# Patient Record
Sex: Male | Born: 1955 | Race: White | Hispanic: No | Marital: Single | Smoking: Never smoker
Health system: Southern US, Community
[De-identification: ages and names within clinical notes are randomized; demographics above are authoritative.]

## PROBLEM LIST (undated history)

## (undated) DIAGNOSIS — F17200 Nicotine dependence, unspecified, uncomplicated: Secondary | ICD-10-CM

## (undated) DIAGNOSIS — R221 Localized swelling, mass and lump, neck: Secondary | ICD-10-CM

## (undated) DIAGNOSIS — Z720 Tobacco use: Secondary | ICD-10-CM

## (undated) DIAGNOSIS — I10 Essential (primary) hypertension: Secondary | ICD-10-CM

## (undated) DIAGNOSIS — I219 Acute myocardial infarction, unspecified: Secondary | ICD-10-CM

## (undated) DIAGNOSIS — E785 Hyperlipidemia, unspecified: Secondary | ICD-10-CM

## (undated) DIAGNOSIS — I214 Non-ST elevation (NSTEMI) myocardial infarction: Secondary | ICD-10-CM

## (undated) HISTORY — DX: Non-ST elevation (NSTEMI) myocardial infarction: I21.4

## (undated) HISTORY — DX: Nicotine dependence, unspecified, uncomplicated: F17.200

---

## 2010-09-29 ENCOUNTER — Emergency Department (HOSPITAL_COMMUNITY)
Admission: EM | Admit: 2010-09-29 | Discharge: 2010-09-29 | Payer: Self-pay | Source: Home / Self Care | Admitting: Emergency Medicine

## 2010-10-01 ENCOUNTER — Emergency Department (HOSPITAL_COMMUNITY)
Admission: EM | Admit: 2010-10-01 | Discharge: 2010-10-01 | Payer: Self-pay | Source: Home / Self Care | Admitting: Emergency Medicine

## 2011-08-18 ENCOUNTER — Emergency Department (HOSPITAL_COMMUNITY): Payer: Self-pay

## 2011-08-18 ENCOUNTER — Emergency Department (HOSPITAL_COMMUNITY)
Admission: EM | Admit: 2011-08-18 | Discharge: 2011-08-18 | Disposition: A | Discharge status: Home / Self Care | Payer: Self-pay | Attending: Emergency Medicine | Admitting: Emergency Medicine

## 2011-08-18 DIAGNOSIS — K859 Acute pancreatitis without necrosis or infection, unspecified: Secondary | ICD-10-CM | POA: Insufficient documentation

## 2011-08-18 DIAGNOSIS — R112 Nausea with vomiting, unspecified: Secondary | ICD-10-CM | POA: Insufficient documentation

## 2011-08-18 LAB — COMPREHENSIVE METABOLIC PANEL
ALT: 63 U/L — ABNORMAL HIGH (ref 0–53)
Albumin: 3.5 g/dL (ref 3.5–5.2)
Alkaline Phosphatase: 158 U/L — ABNORMAL HIGH (ref 39–117)
Potassium: 4.1 mEq/L (ref 3.5–5.1)
Sodium: 135 mEq/L (ref 135–145)
Total Protein: 6.8 g/dL (ref 6.0–8.3)

## 2011-08-18 LAB — URINE MICROSCOPIC-ADD ON

## 2011-08-18 LAB — URINALYSIS, ROUTINE W REFLEX MICROSCOPIC
Hgb urine dipstick: NEGATIVE
Nitrite: POSITIVE — AB
Specific Gravity, Urine: 1.026 (ref 1.005–1.030)
pH: 6.5 (ref 5.0–8.0)

## 2011-08-18 LAB — CBC
MCHC: 35.3 g/dL (ref 30.0–36.0)
Platelets: 183 10*3/uL (ref 150–400)
RDW: 13.4 % (ref 11.5–15.5)

## 2011-08-18 LAB — DIFFERENTIAL
Basophils Absolute: 0 10*3/uL (ref 0.0–0.1)
Basophils Relative: 0 % (ref 0–1)
Eosinophils Absolute: 0 10*3/uL (ref 0.0–0.7)
Eosinophils Relative: 0 % (ref 0–5)
Monocytes Absolute: 0.7 10*3/uL (ref 0.1–1.0)
Neutro Abs: 7.9 10*3/uL — ABNORMAL HIGH (ref 1.7–7.7)

## 2016-07-31 ENCOUNTER — Encounter (HOSPITAL_COMMUNITY): Payer: Self-pay | Admitting: Nurse Practitioner

## 2016-07-31 ENCOUNTER — Emergency Department (HOSPITAL_COMMUNITY)
Admission: EM | Admit: 2016-07-31 | Discharge: 2016-07-31 | Disposition: A | Discharge status: Home / Self Care | Payer: Self-pay | Attending: Dermatology | Admitting: Dermatology

## 2016-07-31 DIAGNOSIS — Z5321 Procedure and treatment not carried out due to patient leaving prior to being seen by health care provider: Secondary | ICD-10-CM | POA: Insufficient documentation

## 2016-07-31 DIAGNOSIS — Y69 Unspecified misadventure during surgical and medical care: Secondary | ICD-10-CM | POA: Insufficient documentation

## 2016-07-31 DIAGNOSIS — T814XXA Infection following a procedure, initial encounter: Secondary | ICD-10-CM | POA: Insufficient documentation

## 2016-07-31 NOTE — ED Notes (Signed)
Patient called X 2 in main waiting area with no response.

## 2016-07-31 NOTE — ED Triage Notes (Signed)
Pt presents with c/o wound that will not heal. He first noticed the wound 3 months ago. He does not know how the wound began. He has been applying neosporin but the wound continues to ooze blood every night while he sleeps and will not heal. He is alert and breathing easily.

## 2016-08-08 ENCOUNTER — Emergency Department (HOSPITAL_COMMUNITY)
Admission: EM | Admit: 2016-08-08 | Discharge: 2016-08-08 | Disposition: A | Discharge status: Home / Self Care | Payer: Self-pay | Attending: Emergency Medicine | Admitting: Emergency Medicine

## 2016-08-08 ENCOUNTER — Encounter (HOSPITAL_COMMUNITY): Payer: Self-pay | Admitting: Emergency Medicine

## 2016-08-08 DIAGNOSIS — L989 Disorder of the skin and subcutaneous tissue, unspecified: Secondary | ICD-10-CM | POA: Insufficient documentation

## 2016-08-08 NOTE — Progress Notes (Signed)
[facility] consulted by [place] for dermatologist follow up.  Patient is without a pcp or insurance.  EDCM spoke to patient at bedside.  Patient is agreeable to establish care at [facility].  Informed patient to speak to a financial counselor to enroll for orange card.  Patient vrbalized understanding.  Voiced concerns about being discharged as he does not want to miss the bus.  EDCM informed EDPA. EDCM will email [place] in efforts to obtain an appointment.  No further EDCM needs at this time.

## 2016-08-08 NOTE — ED Provider Notes (Signed)
[name] DEPT Provider Note   CSN: [id number] Arrival date & time: 08/08/16  1429     History   Chief Complaint Chief Complaint  Patient presents with  . Mass    Left Neck    HPI Austin Payne is a 60 y.o. male.  Patient is 60 yo M with no PMH, presenting with painless but bleeding lesion noted to left side of neck. States he first noticed the lesion 4 months ago, at which time "it was the size of a pin-hole." He states the lesion has increased in size over the past 4 months, and has been "draining blood" over the past several months. He does not have a PCP or health insurance, and denies ever seeing a dermatologist or other medical provider about the lesion. He works as a Training and development officer, but denies any prior burn or wound to his neck. No other personal or family hx of cancer or chronic medical conditions. He denies any fever, chills, rashes, or other changes to his hair, skin, or nails.       History reviewed. No pertinent past medical history.  There are no active problems to display for this patient.   History reviewed. No pertinent surgical history.     Home Medications    Prior to Admission medications   Not on File    Family History No family history on file.  Social History Social History  Substance Use Topics  . Smoking status: Never Smoker  . Smokeless tobacco: Never Used  . Alcohol use No     Allergies   Contrast media [iodinated diagnostic agents]   Review of Systems Review of Systems  Constitutional: Negative for chills, fatigue, fever and unexpected weight change.  HENT: Negative for mouth sores, sore throat and trouble swallowing.   Musculoskeletal: Negative for neck pain and neck stiffness.  Skin: Positive for wound. Negative for rash.  Hematological: Negative for adenopathy.     Physical Exam Updated Vital Signs BP 123/81 (BP Location: Right Arm)   Pulse 69   Temp 97.5 F (36.4 C) (Oral)   Resp 18   Ht 6' (1.829 m)   Wt 79.4 kg    SpO2 94%   BMI 23.73 kg/m   Physical Exam  Constitutional:  Elderly male, sitting comfortably on bed, in no acute distress  HENT:  Head: Normocephalic and atraumatic.  Right Ear: External ear normal.  Left Ear: External ear normal.  Nose: Nose normal.  Mouth/Throat: Oropharynx is clear and moist.  Eyes: Conjunctivae are normal. Pupils are equal, round, and reactive to light.  Neck: Normal range of motion. Neck supple.  Cardiovascular: Normal rate, regular rhythm, normal heart sounds and intact distal pulses.   Pulmonary/Chest: Effort normal. No respiratory distress.  Abdominal: Soft. There is no tenderness.  Musculoskeletal: Normal range of motion.  Lymphadenopathy:    He has no cervical adenopathy.  Neurological: He is alert.  Skin: Skin is warm and dry.  4 x 1.5 cm irregularly shaped, ulcerated, and bleeding lesion. Image below:      ED Treatments / Results  Labs (all labs ordered are listed, but only abnormal results are displayed) Labs Reviewed - No data to display  EKG  EKG Interpretation None       Radiology No results found.  Procedures Procedures (including critical care time)  Medications Ordered in ED Medications - No data to display   Initial Impression / Assessment and Plan / ED Course  I have reviewed the triage vital signs and the  nursing notes.  Pertinent labs & imaging results that were available during my care of the patient were reviewed by me and considered in my medical decision making (see chart for details).  Clinical Course   Patient is 60 yo M presenting with painless but bleeding lesion noted to left side of neck. States it has increased in size over the past 4 months. Has not seen any other medical provider about the lesion due to lack of insurance. The lesion is irregularly shaped and concerning for possible malignancy, but the patient has no adenopathy or concerning findings on physical exam. Attending physician, Dr. Gara Kroner,  evaluated patient, and discussed with patient the importance of f/u with dermatologist for biopsy. No other interventions or lab studies indicated. Provided patient with contact information for several local dermatology offices, and stressed the importance of being evaluated in the next 5 days. Case management consulted to assist patient with finding PCP and dermatologist. Patient agreed to d/c plan and understood the risks of not following up with dermatology. Return precautions to ED discussed with patient.  Final Clinical Impressions(s) / ED Diagnoses   Final diagnoses:  Skin lesion of neck    New Prescriptions New Prescriptions   No medications on file     Rosilyn Mings II, PA 08/08/16 1827    Margette Fast, MD 08/09/16 1317

## 2016-08-08 NOTE — Discharge Instructions (Addendum)
[facility] Dermatologists:   [facility]  3.2 [phone])  Dermatologist  [place] [facility] [place]  [phone]   Dr. Michelene Gardener, MD  2.6 [phone])  Dermatologist  [place]  [phone]  [facility]  3.5 (3)  [facility]  [place]  [phone]   [place]  4.0 (4)  Dermatologist  [place]  [phone]  Lavonna Monarch MD  3.0 (2)  Dermatologist  [place]  [phone]  Katrina Stack  2.7 (6)  Dermatologist  [place]  [phone]  [place] Amy Y MD  2.0 (1)  Dermatologist  [place]  [phone]  [place]  5.0 (3)  Doctor  [address]  [phone]

## 2016-08-08 NOTE — Progress Notes (Addendum)
CM spoke with pt who confirms uninsured [employer] resident with no pcp.  CM discussed and provided written information to assist pt with determining choice for uninsured accepting pcps, discussed the importance of pcp vs EDP services for f/u care, www.needymeds.org, www.goodrx.com, discounted pharmacies and other [employer] such as [employer] [facility] [employer], affordable care act, financial assistance, uninsured dental services, [place] med assist, DSS and  health department  Reviewed resources for [employer] uninsured accepting pcps like [facility]oss[facility], family medicine at [employer], [facility], [facility], local urgent care centers, [facility], [facility] family practice, general medical clinics, family services of the [facility] [employer], [facility] urgent care plus others, medication resources, [facility] out patient pharmacies and housing Pt voiced understanding and appreciation of resources provided   Provided [facility] contact information Pt agreed to a referral Cm completed referral Pt to be contact by [facility] clinical liaison Pt denies having an orange card before  Assisted  to find nearest rest room ] No orders for UA noted in EPIC

## 2016-08-08 NOTE — ED Notes (Signed)
MD d/c'd pt
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
## 2016-08-09 ENCOUNTER — Telehealth: Payer: Self-pay

## 2016-08-09 NOTE — Telephone Encounter (Signed)
This Case Manager received communication from Livia Snellen, RN CM who indicated patient is uninsured and does not have a PCP. Needs hospital follow-up appointment for left neck mass. Call placed to patient at [phone]; however, unable to reach patient. HIPPA compliant voicemail left requesting return call.

## 2016-08-11 ENCOUNTER — Telehealth: Payer: Self-pay

## 2016-08-11 NOTE — Telephone Encounter (Signed)
This Case Manager received communication from Livia Snellen, RN CM who indicated patient is uninsured and does not have a PCP. Needs hospital follow-up appointment for left neck mass. An additional call made to patient at [phone]; however, unable to reach patient. HIPPA compliant voicemail left requesting return call.

## 2016-08-14 ENCOUNTER — Telehealth: Payer: Self-pay

## 2016-08-14 NOTE — Telephone Encounter (Signed)
Message received from Livia Snellen, RN CM requesting a hospital follow up appointment for the patient. Call placed to # [phone] (H) and the message noted that this number is not in service. Call placed to #  [phone] (M) and a HIPAA compliant voicemail message was left requesting a call back to # [phone] or [phone].

## 2016-08-16 ENCOUNTER — Telehealth: Payer: Self-pay

## 2016-08-16 NOTE — Telephone Encounter (Signed)
Attempted again to contact the patient to check on his status and to discuss scheduling a hospital follow up appointment.  Call placed to # [phone] and the message noted that the number is not in service.  Call placed to # [phone] - [phone] and a HIPAA compliant voicemail message was left requesting a call back to # [phone].

## 2016-08-25 ENCOUNTER — Telehealth: Payer: Self-pay

## 2016-08-25 NOTE — Telephone Encounter (Signed)
Received call from patient who indicated he received the letter this Case Manager sent regarding need for ED follow-up appointment for left neck mass.  Patient is uninsured and does not have a PCP. Discussed the resources (including onsite pharmacy, Financial Counseling) available at [facility]. Patient appreciative of information and agreeable to appointment being scheduled. Appointment scheduled for 08/31/16 at 1600 with Freeman Caldron, [place]. Patient given clinic's address and phone number. Patient appreciative of information and indicated he planned to take bus to his appointment.

## 2016-08-31 ENCOUNTER — Inpatient Hospital Stay: Payer: Self-pay

## 2017-04-21 ENCOUNTER — Encounter (HOSPITAL_COMMUNITY): Payer: Self-pay | Admitting: Emergency Medicine

## 2017-04-21 ENCOUNTER — Emergency Department (HOSPITAL_COMMUNITY): Payer: Self-pay

## 2017-04-21 ENCOUNTER — Inpatient Hospital Stay (HOSPITAL_COMMUNITY)
Admission: EM | Admit: 2017-04-21 | Discharge: 2017-04-24 | DRG: 247 | Disposition: A | Discharge status: Home / Self Care | Payer: Self-pay | Attending: Cardiovascular Disease | Admitting: Cardiovascular Disease

## 2017-04-21 ENCOUNTER — Inpatient Hospital Stay (HOSPITAL_COMMUNITY): Payer: Self-pay

## 2017-04-21 DIAGNOSIS — F17200 Nicotine dependence, unspecified, uncomplicated: Secondary | ICD-10-CM

## 2017-04-21 DIAGNOSIS — I214 Non-ST elevation (NSTEMI) myocardial infarction: Principal | ICD-10-CM

## 2017-04-21 DIAGNOSIS — IMO0001 Reserved for inherently not codable concepts without codable children: Secondary | ICD-10-CM

## 2017-04-21 DIAGNOSIS — IMO0002 Reserved for concepts with insufficient information to code with codable children: Secondary | ICD-10-CM

## 2017-04-21 DIAGNOSIS — R221 Localized swelling, mass and lump, neck: Secondary | ICD-10-CM

## 2017-04-21 DIAGNOSIS — Z91041 Radiographic dye allergy status: Secondary | ICD-10-CM

## 2017-04-21 DIAGNOSIS — Z720 Tobacco use: Secondary | ICD-10-CM

## 2017-04-21 DIAGNOSIS — C4441 Basal cell carcinoma of skin of scalp and neck: Secondary | ICD-10-CM

## 2017-04-21 DIAGNOSIS — E785 Hyperlipidemia, unspecified: Secondary | ICD-10-CM | POA: Diagnosis present

## 2017-04-21 DIAGNOSIS — F17210 Nicotine dependence, cigarettes, uncomplicated: Secondary | ICD-10-CM | POA: Diagnosis present

## 2017-04-21 DIAGNOSIS — R229 Localized swelling, mass and lump, unspecified: Secondary | ICD-10-CM

## 2017-04-21 DIAGNOSIS — Z955 Presence of coronary angioplasty implant and graft: Secondary | ICD-10-CM

## 2017-04-21 HISTORY — DX: Non-ST elevation (NSTEMI) myocardial infarction: I21.4

## 2017-04-21 HISTORY — DX: Hyperlipidemia, unspecified: E78.5

## 2017-04-21 HISTORY — DX: Acute myocardial infarction, unspecified: I21.9

## 2017-04-21 HISTORY — DX: Nicotine dependence, unspecified, uncomplicated: F17.200

## 2017-04-21 HISTORY — DX: Essential (primary) hypertension: I10

## 2017-04-21 HISTORY — DX: Tobacco use: Z72.0

## 2017-04-21 HISTORY — DX: Localized swelling, mass and lump, neck: R22.1

## 2017-04-21 LAB — I-STAT TROPONIN, ED: Troponin i, poc: 4.23 ng/mL (ref 0.00–0.08)

## 2017-04-21 LAB — CBC
HEMATOCRIT: 42.7 % (ref 39.0–52.0)
Hemoglobin: 14.8 g/dL (ref 13.0–17.0)
MCH: 31 pg (ref 26.0–34.0)
MCHC: 34.7 g/dL (ref 30.0–36.0)
MCV: 89.5 fL (ref 78.0–100.0)
Platelets: 216 10*3/uL (ref 150–400)
RBC: 4.77 MIL/uL (ref 4.22–5.81)
RDW: 13.2 % (ref 11.5–15.5)
WBC: 9.8 10*3/uL (ref 4.0–10.5)

## 2017-04-21 LAB — TROPONIN I
TROPONIN I: 3.36 ng/mL — AB (ref <0.03)
Troponin I: 3.69 ng/mL (ref <0.03)

## 2017-04-21 LAB — BASIC METABOLIC PANEL
Anion gap: 9 (ref 5–15)
BUN: 21 mg/dL — AB (ref 6–20)
CHLORIDE: 106 mmol/L (ref 101–111)
CO2: 23 mmol/L (ref 22–32)
Calcium: 9.5 mg/dL (ref 8.9–10.3)
Creatinine, Ser: 1.26 mg/dL — ABNORMAL HIGH (ref 0.61–1.24)
GFR calc Af Amer: >60 mL/min (ref >60)
GFR calc non Af Amer: 59 mL/min — ABNORMAL LOW (ref >60)
GLUCOSE: 105 mg/dL — AB (ref 65–99)
POTASSIUM: 3.8 mmol/L (ref 3.5–5.1)
Sodium: 138 mmol/L (ref 135–145)

## 2017-04-21 LAB — MRSA PCR SCREENING: MRSA by PCR: NEGATIVE

## 2017-04-21 LAB — HEPARIN LEVEL (UNFRACTIONATED): Heparin Unfractionated: 0.22 IU/mL — ABNORMAL LOW (ref 0.30–0.70)

## 2017-04-21 MED ORDER — NITROGLYCERIN IN D5W 200-5 MCG/ML-% IV SOLN
2.0000 ug/min | INTRAVENOUS | Status: DC
  Administered 2017-04-21: 2 ug/min via INTRAVENOUS
  Filled 2017-04-21: qty 250

## 2017-04-21 MED ORDER — ACETAMINOPHEN 325 MG PO TABS: 650.0000 mg | ORAL_TABLET | ORAL | Status: DC | PRN

## 2017-04-21 MED ORDER — HEPARIN (PORCINE) IN NACL 100-0.45 UNIT/ML-% IJ SOLN
1300.0000 [IU]/h | INTRAMUSCULAR | Status: DC
  Administered 2017-04-22: 1300 [IU]/h via INTRAVENOUS
  Administered 2017-04-23: 1250 [IU]/h via INTRAVENOUS
  Filled 2017-04-21 (×2): qty 250

## 2017-04-21 MED ORDER — ONDANSETRON HCL 4 MG/2ML IJ SOLN: 4.0000 mg | Freq: Four times a day (QID) | INTRAMUSCULAR | Status: DC | PRN

## 2017-04-21 MED ORDER — HEPARIN BOLUS VIA INFUSION
4000.0000 [IU] | Freq: Once | INTRAVENOUS | Status: AC
  Administered 2017-04-21: 4000 [IU] via INTRAVENOUS
  Filled 2017-04-21: qty 4000

## 2017-04-21 MED ORDER — ASPIRIN 300 MG RE SUPP: 300.0000 mg | RECTAL | Status: AC

## 2017-04-21 MED ORDER — ASPIRIN 81 MG PO CHEW: 324.0000 mg | CHEWABLE_TABLET | ORAL | Status: AC

## 2017-04-21 MED ORDER — NICOTINE 21 MG/24HR TD PT24
21.0000 mg | MEDICATED_PATCH | TRANSDERMAL | Status: DC
  Administered 2017-04-21 – 2017-04-23 (×3): 21 mg via TRANSDERMAL
  Filled 2017-04-21 (×3): qty 1

## 2017-04-21 MED ORDER — ASPIRIN EC 81 MG PO TBEC
81.0000 mg | DELAYED_RELEASE_TABLET | Freq: Every day | ORAL | Status: DC
  Administered 2017-04-22: 81 mg via ORAL
  Filled 2017-04-21: qty 1

## 2017-04-21 MED ORDER — NITROGLYCERIN 0.4 MG SL SUBL: 0.4000 mg | SUBLINGUAL_TABLET | SUBLINGUAL | Status: DC | PRN

## 2017-04-21 MED ORDER — METOPROLOL TARTRATE 12.5 MG HALF TABLET
12.5000 mg | ORAL_TABLET | Freq: Two times a day (BID) | ORAL | Status: DC
  Administered 2017-04-21 – 2017-04-24 (×6): 12.5 mg via ORAL
  Filled 2017-04-21 (×6): qty 1

## 2017-04-21 MED ORDER — IOPAMIDOL (ISOVUE-300) INJECTION 61%
INTRAVENOUS | Status: AC
  Filled 2017-04-21: qty 75

## 2017-04-21 MED ORDER — ASPIRIN 81 MG PO CHEW
324.0000 mg | CHEWABLE_TABLET | Freq: Once | ORAL | Status: AC
  Administered 2017-04-21: 324 mg via ORAL
  Filled 2017-04-21: qty 4

## 2017-04-21 MED ORDER — HEPARIN (PORCINE) IN NACL 100-0.45 UNIT/ML-% IJ SOLN
950.0000 [IU]/h | INTRAMUSCULAR | Status: DC
  Administered 2017-04-21: 950 [IU]/h via INTRAVENOUS
  Filled 2017-04-21: qty 250
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
## 2017-04-21 NOTE — Progress Notes (Signed)
CRITICAL VALUE ALERT  Critical Value:  Troponin 3.63  Date & Time Notied:  04/21/2017 1721  Provider Notified: Dr. Eula Fried with Cardiology  Orders Received/Actions taken: Patient denies CP.  No new orders received.  Sanda Linger
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
## 2017-04-21 NOTE — ED Notes (Signed)
Waiting for nitro to be verified by pharmacy and heparin to arrive from pharmacy

## 2017-04-21 NOTE — Progress Notes (Signed)
ANTICOAGULATION CONSULT NOTE - Initial Consult  Pharmacy Consult for heparin Indication: chest pain/ACS  Allergies  Allergen Reactions  . Contrast Media [Iodinated Diagnostic Agents] Other (See Comments)    Unknown.    Patient Measurements: Height: 6' (182.9 cm) Weight: 175 lb (79.4 kg) IBW/kg (Calculated) : 77.6 Heparin Dosing Weight: 79 kg  Vital Signs: Temp: 98.3 F (36.8 C) (06/23 1051) Temp Source: Oral (06/23 1051) BP: 118/80 (06/23 1051) Pulse Rate: 93 (06/23 1051)  Labs:  Recent Labs  04/21/17 1059  HGB 14.8  HCT 42.7  PLT 216  CREATININE 1.26*    Estimated Creatinine Clearance: 66.7 mL/min (A) (by C-G formula based on SCr of 1.26 mg/dL (H)).   Medical History: History reviewed. No pertinent past medical history.  Medications:  See electronic medication history  Assessment: 61 y/o male who presents to the ED with chest pain for the last 3 days. Pharmacy consulted to begin IV heparin. No bleeding noted, CBC is normal.  Goal of Therapy:  Heparin level 0.3-0.7 units/ml Monitor platelets by anticoagulation protocol: Yes   Plan:  - Heparin 4000 units IV bolus then infuse at 950 units/hr - 6 hr heparin level - Daily heparin level and CBC - Monitor for s/sx of bleeding   Renold Genta, PharmD, BCPS Clinical Pharmacist Phone for today - [place] - [phone] 04/21/2017 11:57 AM

## 2017-04-21 NOTE — ED Triage Notes (Signed)
Pt. Stated, I started having chest pain 2 days ago with both arms in pain for 2 days.
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
## 2017-04-21 NOTE — Progress Notes (Signed)
ANTICOAGULATION CONSULT NOTE - Follow Up Consult  Pharmacy Consult for Heparin Indication: chest pain/ACS  Allergies  Allergen Reactions  . Contrast Media [Iodinated Diagnostic Agents] Other (See Comments)    Unknown.    Patient Measurements: Height: 6' (182.9 cm) Weight: 173 lb (78.5 kg) IBW/kg (Calculated) : 77.6  Vital Signs: Temp: 98.2 F (36.8 C) (06/23 1515) Temp Source: Oral (06/23 1515) BP: 106/67 (06/23 1445) Pulse Rate: 63 (06/23 1515)  Labs:  Recent Labs  04/21/17 1059 04/21/17 1536 04/21/17 1820  HGB 14.8  --   --   HCT 42.7  --   --   PLT 216  --   --   HEPARINUNFRC  --   --  0.22*  CREATININE 1.26*  --   --   TROPONINI  --  3.36*  --     Estimated Creatinine Clearance: 66.7 mL/min (A) (by C-G formula based on SCr of 1.26 mg/dL (H)).   Medications:  Heparin @ 950 units/hr  Assessment: 61yom started on heparin earlier today for SEMI with plan for cath Monday. Initial heparin level is below goal at 0.22. No bleeding.   Goal of Therapy:  Heparin level 0.3-0.7 units/ml Monitor platelets by anticoagulation protocol: Yes   Plan:  1) Increase heparin to 1100 units/hr 2) Check 6 hour heparin level  Deboraha Sprang 04/21/2017,7:08 PM

## 2017-04-22 LAB — CBC
HCT: 39.6 % (ref 39.0–52.0)
Hemoglobin: 13.6 g/dL (ref 13.0–17.0)
MCH: 31 pg (ref 26.0–34.0)
MCHC: 34.3 g/dL (ref 30.0–36.0)
MCV: 90.2 fL (ref 78.0–100.0)
PLATELETS: 196 10*3/uL (ref 150–400)
RBC: 4.39 MIL/uL (ref 4.22–5.81)
RDW: 13.3 % (ref 11.5–15.5)
WBC: 8.1 10*3/uL (ref 4.0–10.5)

## 2017-04-22 LAB — HIV ANTIBODY (ROUTINE TESTING W REFLEX): HIV SCREEN 4TH GENERATION: NONREACTIVE

## 2017-04-22 LAB — TROPONIN I: Troponin I: 3.99 ng/mL (ref <0.03)

## 2017-04-22 LAB — HEPARIN LEVEL (UNFRACTIONATED)
HEPARIN UNFRACTIONATED: 0.2 [IU]/mL — AB (ref 0.30–0.70)
Heparin Unfractionated: 0.61 IU/mL (ref 0.30–0.70)
Heparin Unfractionated: 0.46 IU/mL (ref 0.30–0.70)

## 2017-04-22 MED ORDER — ASPIRIN 81 MG PO CHEW
81.0000 mg | CHEWABLE_TABLET | ORAL | Status: AC
  Administered 2017-04-23: 81 mg via ORAL
  Filled 2017-04-22: qty 1

## 2017-04-22 MED ORDER — SODIUM CHLORIDE 0.9 % WEIGHT BASED INFUSION
1.0000 mL/kg/h | INTRAVENOUS | Status: DC
  Administered 2017-04-23: 1 mL/kg/h via INTRAVENOUS

## 2017-04-22 MED ORDER — HEPARIN BOLUS VIA INFUSION
2000.0000 [IU] | Freq: Once | INTRAVENOUS | Status: AC
  Administered 2017-04-22: 2000 [IU] via INTRAVENOUS
  Filled 2017-04-22: qty 2000

## 2017-04-22 MED ORDER — SODIUM CHLORIDE 0.9 % WEIGHT BASED INFUSION
3.0000 mL/kg/h | INTRAVENOUS | Status: DC
  Administered 2017-04-22: 3 mL/kg/h via INTRAVENOUS

## 2017-04-22 MED ORDER — SODIUM CHLORIDE 0.9% FLUSH: 3.0000 mL | INTRAVENOUS | Status: DC | PRN

## 2017-04-22 MED ORDER — SODIUM CHLORIDE 0.9% FLUSH
3.0000 mL | Freq: Two times a day (BID) | INTRAVENOUS | Status: DC
  Administered 2017-04-22: 3 mL via INTRAVENOUS

## 2017-04-22 MED ORDER — SODIUM CHLORIDE 0.9 % IV SOLN: 250.0000 mL | INTRAVENOUS | Status: DC | PRN

## 2017-04-22 NOTE — Progress Notes (Signed)
[facility] for Heparin Indication: chest pain/ACS  Allergies  Allergen Reactions  . Contrast Media [Iodinated Diagnostic Agents] Other (See Comments)    Unknown.    Patient Measurements: Height: 6' (182.9 cm) Weight: 159 lb 12.8 oz (72.5 kg) IBW/kg (Calculated) : 77.6  Vital Signs: Temp: 97.5 F (36.4 C) (06/24 0743) Temp Source: Oral (06/24 0743) BP: 111/69 (06/24 0743) Pulse Rate: 61 (06/24 0743)  Labs:  Recent Labs  04/21/17 1059 04/21/17 1536 04/21/17 1820 04/21/17 2111 04/22/17 0220 04/22/17 0432 04/22/17 0933  HGB 14.8  --   --   --   --  13.6  --   HCT 42.7  --   --   --   --  39.6  --   PLT 216  --   --   --   --  196  --   HEPARINUNFRC  --   --  0.22*  --  0.20*  --  0.61  CREATININE 1.26*  --   --   --   --   --   --   TROPONINI  --  3.36*  --  3.69*  --  3.99*  --     Estimated Creatinine Clearance: 62.3 mL/min (A) (by C-G formula based on SCr of 1.26 mg/dL (H)).  Assessment: 61 y.o. male with chest pain for heparin. No anticoagulation PTA.  This is first therapeutic heparin level at 0.61 on 1300 units/hr. CBC all within normal limits, and no notes of bleeding. Will decrease slightly because of big jump in heparin level.    Goal of Therapy:  Heparin level 0.3-0.7 units/ml Monitor platelets by anticoagulation protocol: Yes   Plan:  Decrease heparin level slightly to 1250 units/hr Check heparin level in 6 hours to confirm  Daily CBC/HL while on heparin  Likely cath Mon AM  Demetrius Charity, PharmD Acute Care Pharmacy Resident  Pager: [phone] 04/22/2017

## 2017-04-22 NOTE — Progress Notes (Signed)
[place] for Heparin Indication: chest pain/ACS  Allergies  Allergen Reactions  . Contrast Media [Iodinated Diagnostic Agents] Other (See Comments)    Unknown.    Patient Measurements: Height: 6' (182.9 cm) Weight: 173 lb (78.5 kg) IBW/kg (Calculated) : 77.6  Vital Signs: Temp: 98.1 F (36.7 C) (06/24 0047) Temp Source: Oral (06/24 0047) BP: 115/71 (06/24 0047) Pulse Rate: 68 (06/24 0047)  Labs:  Recent Labs  04/21/17 1059 04/21/17 1536 04/21/17 1820 04/21/17 2111 04/22/17 0220  HGB 14.8  --   --   --   --   HCT 42.7  --   --   --   --   PLT 216  --   --   --   --   HEPARINUNFRC  --   --  0.22*  --  0.20*  CREATININE 1.26*  --   --   --   --   TROPONINI  --  3.36*  --  3.69*  --     Estimated Creatinine Clearance: 66.7 mL/min (A) (by C-G formula based on SCr of 1.26 mg/dL (H)).  Assessment: 61 y.o. male with chest pain for heparin    Goal of Therapy:  Heparin level 0.3-0.7 units/ml Monitor platelets by anticoagulation protocol: Yes   Plan:  Heparin 2000 units IV bolus, then increase heparin 1300 units/hr Check heparin level in 6 hours.   Caryl Pina 04/22/2017,3:44 AM
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
## 2017-04-22 NOTE — Plan of Care (Signed)
Problem: Education: Goal: Knowledge of  General Education information/materials will improve Outcome: Progressing Patient aware of plan of care.  Patient denying pain.  RN instructed patient to notify RN if patient started to experience any pain.  Patient agreeable with RN's instruction.

## 2017-04-22 NOTE — Progress Notes (Signed)
ANTICOAGULATION CONSULT NOTE - Follow Up Consult  Pharmacy Consult for Heparin Indication: chest pain/ACS  Allergies  Allergen Reactions  . Contrast Media [Iodinated Diagnostic Agents] Other (See Comments)    Unknown.    Patient Measurements: Height: 6' (182.9 cm) Weight: 159 lb 12.8 oz (72.5 kg) IBW/kg (Calculated) : 77.6  Vital Signs: Temp: 98.7 F (37.1 C) (06/24 1632) Temp Source: Oral (06/24 1632) BP: 122/63 (06/24 1632) Pulse Rate: 66 (06/24 1632)  Labs:  Recent Labs  04/21/17 1059 04/21/17 1536  04/21/17 2111 04/22/17 0220 04/22/17 0432 04/22/17 0933 04/22/17 1600  HGB 14.8  --   --   --   --  13.6  --   --   HCT 42.7  --   --   --   --  39.6  --   --   PLT 216  --   --   --   --  196  --   --   HEPARINUNFRC  --   --   < >  --  0.20*  --  0.61 0.46  CREATININE 1.26*  --   --   --   --   --   --   --   TROPONINI  --  3.36*  --  3.69*  --  3.99*  --   --   < > = values in this interval not displayed.  Estimated Creatinine Clearance: 62.3 mL/min (A) (by C-G formula based on SCr of 1.26 mg/dL (H)).   Medications:  Heparin @ 1250 units/hr  Assessment: 61yom continues on heparin for SEMI with plan for cath tomorrow. Confirmatory heparin level is therapeutic at 0.46. No bleeding.  Goal of Therapy:  Heparin level 0.3-0.7 units/ml Monitor platelets by anticoagulation protocol: Yes   Plan:  1) Continue heparin at 1250 units/hr 2) Follow up daily heparin level and CBC  Deboraha Sprang 04/22/2017,4:42 PM

## 2017-04-23 ENCOUNTER — Encounter (HOSPITAL_COMMUNITY): Payer: Self-pay | Admitting: Cardiovascular Disease

## 2017-04-23 ENCOUNTER — Inpatient Hospital Stay (HOSPITAL_COMMUNITY)
Admission: EM | Disposition: A | Discharge status: Home / Self Care | Payer: Self-pay | Source: Home / Self Care | Attending: Cardiovascular Disease

## 2017-04-23 ENCOUNTER — Other Ambulatory Visit: Payer: Self-pay

## 2017-04-23 DIAGNOSIS — I2510 Atherosclerotic heart disease of native coronary artery without angina pectoris: Secondary | ICD-10-CM

## 2017-04-23 DIAGNOSIS — Z720 Tobacco use: Secondary | ICD-10-CM

## 2017-04-23 DIAGNOSIS — I214 Non-ST elevation (NSTEMI) myocardial infarction: Secondary | ICD-10-CM

## 2017-04-23 DIAGNOSIS — E785 Hyperlipidemia, unspecified: Secondary | ICD-10-CM

## 2017-04-23 HISTORY — PX: LEFT HEART CATH AND CORONARY ANGIOGRAPHY: CATH118249

## 2017-04-23 HISTORY — PX: CORONARY STENT INTERVENTION: CATH118234

## 2017-04-23 LAB — BASIC METABOLIC PANEL
ANION GAP: 9 (ref 5–15)
BUN: 13 mg/dL (ref 6–20)
CHLORIDE: 106 mmol/L (ref 101–111)
CO2: 20 mmol/L — ABNORMAL LOW (ref 22–32)
CREATININE: 0.87 mg/dL (ref 0.61–1.24)
Calcium: 8.8 mg/dL — ABNORMAL LOW (ref 8.9–10.3)
GFR calc non Af Amer: >60 mL/min (ref >60)
Glucose, Bld: 93 mg/dL (ref 65–99)
POTASSIUM: 4.2 mmol/L (ref 3.5–5.1)
SODIUM: 135 mmol/L (ref 135–145)

## 2017-04-23 LAB — PROTIME-INR
INR: 1.01
Prothrombin Time: 13.3 seconds (ref 11.4–15.2)

## 2017-04-23 LAB — CBC
HCT: 37.1 % — ABNORMAL LOW (ref 39.0–52.0)
Hemoglobin: 12.6 g/dL — ABNORMAL LOW (ref 13.0–17.0)
MCH: 30.6 pg (ref 26.0–34.0)
MCHC: 34 g/dL (ref 30.0–36.0)
MCV: 90 fL (ref 78.0–100.0)
Platelets: 188 10*3/uL (ref 150–400)
RBC: 4.12 MIL/uL — ABNORMAL LOW (ref 4.22–5.81)
RDW: 13.3 % (ref 11.5–15.5)
WBC: 7.3 10*3/uL (ref 4.0–10.5)

## 2017-04-23 LAB — HEPARIN LEVEL (UNFRACTIONATED): Heparin Unfractionated: 0.28 IU/mL — ABNORMAL LOW (ref 0.30–0.70)

## 2017-04-23 LAB — LIPID PANEL
CHOLESTEROL: 177 mg/dL (ref 0–200)
HDL: 32 mg/dL — ABNORMAL LOW (ref >40)
LDL Cholesterol: 120 mg/dL — ABNORMAL HIGH (ref 0–99)
TRIGLYCERIDES: 126 mg/dL (ref <150)
Total CHOL/HDL Ratio: 5.5 RATIO
VLDL: 25 mg/dL (ref 0–40)

## 2017-04-23 LAB — POCT ACTIVATED CLOTTING TIME: Activated Clotting Time: 445 s

## 2017-04-23 SURGERY — LEFT HEART CATH AND CORONARY ANGIOGRAPHY
Anesthesia: LOCAL

## 2017-04-23 MED ORDER — MORPHINE SULFATE (PF) 4 MG/ML IV SOLN: 2.0000 mg | INTRAVENOUS | Status: DC | PRN

## 2017-04-23 MED ORDER — LIDOCAINE HCL (PF) 1 % IJ SOLN
INTRAMUSCULAR | Status: AC
  Filled 2017-04-23: qty 30

## 2017-04-23 MED ORDER — TICAGRELOR 90 MG PO TABS
90.0000 mg | ORAL_TABLET | Freq: Two times a day (BID) | ORAL | Status: DC
  Administered 2017-04-23 – 2017-04-24 (×2): 90 mg via ORAL
  Filled 2017-04-23 (×2): qty 1

## 2017-04-23 MED ORDER — HYDRALAZINE HCL 20 MG/ML IJ SOLN: 5.0000 mg | INTRAMUSCULAR | Status: DC | PRN

## 2017-04-23 MED ORDER — SODIUM CHLORIDE 0.9 % IV SOLN
1.7500 mg/kg/h | INTRAVENOUS | Status: DC
  Filled 2017-04-23: qty 250

## 2017-04-23 MED ORDER — MIDAZOLAM HCL 2 MG/2ML IJ SOLN
INTRAMUSCULAR | Status: DC | PRN
  Administered 2017-04-23: 1 mg via INTRAVENOUS

## 2017-04-23 MED ORDER — FAMOTIDINE IN NACL 20-0.9 MG/50ML-% IV SOLN
20.0000 mg | Freq: Once | INTRAVENOUS | Status: AC
Start: 2017-04-23 — End: 2017-04-23
  Administered 2017-04-23: 20 mg via INTRAVENOUS

## 2017-04-23 MED ORDER — HEPARIN SODIUM (PORCINE) 1000 UNIT/ML IJ SOLN
INTRAMUSCULAR | Status: AC
  Filled 2017-04-23: qty 1

## 2017-04-23 MED ORDER — BIVALIRUDIN BOLUS VIA INFUSION - CUPID
INTRAVENOUS | Status: DC | PRN
  Administered 2017-04-23: 55.275 mg via INTRAVENOUS

## 2017-04-23 MED ORDER — VERAPAMIL HCL 2.5 MG/ML IV SOLN
INTRA_ARTERIAL | Status: DC | PRN
  Administered 2017-04-23: 10 mL via INTRA_ARTERIAL

## 2017-04-23 MED ORDER — MIDAZOLAM HCL 2 MG/2ML IJ SOLN
INTRAMUSCULAR | Status: AC
  Filled 2017-04-23: qty 2

## 2017-04-23 MED ORDER — IOPAMIDOL (ISOVUE-370) INJECTION 76%
INTRAVENOUS | Status: AC
  Filled 2017-04-23: qty 100

## 2017-04-23 MED ORDER — TICAGRELOR 90 MG PO TABS
ORAL_TABLET | ORAL | Status: AC
  Filled 2017-04-23: qty 2

## 2017-04-23 MED ORDER — SODIUM CHLORIDE 0.9 % IV SOLN: 250.0000 mL | INTRAVENOUS | Status: DC | PRN

## 2017-04-23 MED ORDER — FAMOTIDINE IN NACL 20-0.9 MG/50ML-% IV SOLN
INTRAVENOUS | Status: AC
  Filled 2017-04-23: qty 50

## 2017-04-23 MED ORDER — IOPAMIDOL (ISOVUE-370) INJECTION 76%
INTRAVENOUS | Status: AC
  Filled 2017-04-23: qty 50

## 2017-04-23 MED ORDER — DIPHENHYDRAMINE HCL 50 MG/ML IJ SOLN
INTRAMUSCULAR | Status: AC
  Filled 2017-04-23: qty 1

## 2017-04-23 MED ORDER — HEPARIN (PORCINE) IN NACL 2-0.9 UNIT/ML-% IJ SOLN
INTRAMUSCULAR | Status: AC | PRN
  Administered 2017-04-23: 1000 mL

## 2017-04-23 MED ORDER — HEPARIN (PORCINE) IN NACL 2-0.9 UNIT/ML-% IJ SOLN
INTRAMUSCULAR | Status: AC
  Filled 2017-04-23: qty 1000

## 2017-04-23 MED ORDER — LABETALOL HCL 5 MG/ML IV SOLN: 10.0000 mg | INTRAVENOUS | Status: DC | PRN

## 2017-04-23 MED ORDER — FENTANYL CITRATE (PF) 100 MCG/2ML IJ SOLN
INTRAMUSCULAR | Status: AC
  Filled 2017-04-23: qty 2

## 2017-04-23 MED ORDER — SODIUM CHLORIDE 0.9% FLUSH: 3.0000 mL | INTRAVENOUS | Status: DC | PRN

## 2017-04-23 MED ORDER — NITROGLYCERIN 1 MG/10 ML FOR IR/CATH LAB
INTRA_ARTERIAL | Status: DC | PRN
  Administered 2017-04-23: 200 ug via INTRACORONARY

## 2017-04-23 MED ORDER — SODIUM CHLORIDE 0.9% FLUSH
3.0000 mL | Freq: Two times a day (BID) | INTRAVENOUS | Status: DC
  Administered 2017-04-23 (×2): 3 mL via INTRAVENOUS

## 2017-04-23 MED ORDER — DIPHENHYDRAMINE HCL 50 MG/ML IJ SOLN
25.0000 mg | Freq: Once | INTRAMUSCULAR | Status: AC
  Administered 2017-04-23: 25 mg via INTRAVENOUS

## 2017-04-23 MED ORDER — BIVALIRUDIN TRIFLUOROACETATE 250 MG IV SOLR
INTRAVENOUS | Status: AC
  Filled 2017-04-23: qty 250

## 2017-04-23 MED ORDER — HEART ATTACK BOUNCING BOOK
Freq: Once | Status: AC
  Administered 2017-04-23: 23:00:00 1
  Filled 2017-04-23: qty 1

## 2017-04-23 MED ORDER — ACETAMINOPHEN 325 MG PO TABS: 650.0000 mg | ORAL_TABLET | ORAL | Status: DC | PRN

## 2017-04-23 MED ORDER — METHYLPREDNISOLONE SODIUM SUCC 125 MG IJ SOLR
125.0000 mg | Freq: Once | INTRAMUSCULAR | Status: AC
  Administered 2017-04-23: 125 mg via INTRAVENOUS

## 2017-04-23 MED ORDER — VERAPAMIL HCL 2.5 MG/ML IV SOLN
INTRAVENOUS | Status: AC
  Filled 2017-04-23: qty 2

## 2017-04-23 MED ORDER — IOPAMIDOL (ISOVUE-370) INJECTION 76%
INTRAVENOUS | Status: DC | PRN
  Administered 2017-04-23: 185 mL via INTRA_ARTERIAL

## 2017-04-23 MED ORDER — NITROGLYCERIN 1 MG/10 ML FOR IR/CATH LAB
INTRA_ARTERIAL | Status: AC
  Filled 2017-04-23: qty 10

## 2017-04-23 MED ORDER — TICAGRELOR 90 MG PO TABS
ORAL_TABLET | ORAL | Status: DC | PRN
  Administered 2017-04-23: 180 mg via ORAL

## 2017-04-23 MED ORDER — LIDOCAINE HCL (PF) 1 % IJ SOLN
INTRAMUSCULAR | Status: DC | PRN
  Administered 2017-04-23: 2 mL

## 2017-04-23 MED ORDER — FENTANYL CITRATE (PF) 100 MCG/2ML IJ SOLN
INTRAMUSCULAR | Status: DC | PRN
  Administered 2017-04-23: 25 ug via INTRAVENOUS

## 2017-04-23 MED ORDER — METHYLPREDNISOLONE SODIUM SUCC 125 MG IJ SOLR
INTRAMUSCULAR | Status: AC
  Filled 2017-04-23: qty 2

## 2017-04-23 MED ORDER — SODIUM CHLORIDE 0.9 % IV SOLN
INTRAVENOUS | Status: AC | PRN
  Administered 2017-04-23 (×2): 1.75 mg/kg/h via INTRAVENOUS

## 2017-04-23 MED ORDER — ANGIOPLASTY BOOK
Freq: Once | Status: AC
  Administered 2017-04-23: 23:00:00 1
  Filled 2017-04-23: qty 1

## 2017-04-23 MED ORDER — ONDANSETRON HCL 4 MG/2ML IJ SOLN: 4.0000 mg | Freq: Four times a day (QID) | INTRAMUSCULAR | Status: DC | PRN

## 2017-04-23 MED ORDER — ASPIRIN 81 MG PO CHEW
81.0000 mg | CHEWABLE_TABLET | Freq: Every day | ORAL | Status: DC
  Administered 2017-04-24: 09:00:00 81 mg via ORAL
  Filled 2017-04-23: qty 1

## 2017-04-23 MED ORDER — DIPHENHYDRAMINE HCL 50 MG/ML IJ SOLN
INTRAMUSCULAR | Status: DC | PRN
  Administered 2017-04-23: 25 mg via INTRAVENOUS

## 2017-04-23 MED ORDER — SODIUM CHLORIDE 0.9 % IV SOLN: INTRAVENOUS | Status: AC

## 2017-04-23 SURGICAL SUPPLY — 19 items
BALLN SAPPHIRE 2.0X12 (BALLOONS) ×2
BALLOON SAPPHIRE 2.0X12 (BALLOONS) ×1 IMPLANT
CATH EXPO 5FR ANG PIGTAIL 145 (CATHETERS) ×2 IMPLANT
CATH OPTITORQUE TIG 4.0 5F (CATHETERS) ×2 IMPLANT
CATH VISTA GUIDE 6FR XB3.5 (CATHETERS) ×2 IMPLANT
DEVICE RAD COMP TR BAND LRG (VASCULAR PRODUCTS) ×2 IMPLANT
GLIDESHEATH SLEND A-KIT 6F 22G (SHEATH) ×2 IMPLANT
GUIDEWIRE INQWIRE 1.5J.035X260 (WIRE) ×1 IMPLANT
INQWIRE 1.5J .035X260CM (WIRE) ×2
KIT ENCORE 26 ADVANTAGE (KITS) ×2 IMPLANT
KIT HEART LEFT (KITS) ×2 IMPLANT
PACK CARDIAC CATHETERIZATION (CUSTOM PROCEDURE TRAY) ×2 IMPLANT
STENT SYNERGY DES 2.5X16 (Permanent Stent) ×2 IMPLANT
SYR MEDRAD MARK V 150ML (SYRINGE) ×2 IMPLANT
TRANSDUCER W/STOPCOCK (MISCELLANEOUS) ×2 IMPLANT
TUBING CIL FLEX 10 FLL-RA (TUBING) ×2 IMPLANT
WIRE ASAHI FIELDER XT 190CM (WIRE) ×2 IMPLANT
WIRE ASAHI PROWATER 180CM (WIRE) ×2 IMPLANT
WIRE HI TORQ VERSACORE-J 145CM (WIRE) ×2 IMPLANT
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
## 2017-04-23 NOTE — Plan of Care (Signed)
Problem: Safety: Goal: Ability to remain free from injury will improve Outcome: Completed/Met Date Met: 04/23/17 Pt educated on the importance of calling when he needs to use the restroom so that he can be helped with all of the cords. Pt shown how to use the phone to call staff for help.

## 2017-04-23 NOTE — Plan of Care (Signed)
Problem: Pain Managment: Goal: General experience of comfort will improve Outcome: Completed/Met Date Met: 04/23/17 Pt is currently pain free but still educated on the importance of letting RN know if he has any kind of pain especially chest pain.
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
## 2017-04-23 NOTE — Plan of Care (Signed)
Problem: Fluid Volume: Goal: Ability to maintain a balanced intake and output will improve Outcome: Completed/Met Date Met: 04/23/17 Pt educated on the importance of adequate fluid intake prior to cath as well as why he needs to be NPO for cath.

## 2017-04-23 NOTE — Care Management Note (Addendum)
Case Management Note  Patient Details  Name: Austin Payne MRN: [id number] Date of Birth: August 01, 1956  Subjective/Objective:  Pt presented for Chest Pian- Nstemi post cath. Plan for d/c home on Brilinta. Pt is without insurance. Pt does work part-time as a Advertising copywriter. Pt states he is living in a Hotel at this time. Pt is on the bus route for transportation to and from [place].                 Action/Plan: CM did call the [facility] to see if appointments are available. Pt will be able to utilize the [facility] for medications and cost will range from $4.00-$10.00. Office on Lunch CM to call back to schedule appointment. Pt will need the patient assistance from signed for Brilinta-CM to place on Shadow Chart. Pt will need to have his tax forms before faxed in to qualify. Pt will need paper Rx's to take over to the [facility].   Expected Discharge Date:                  Expected Discharge Plan:  Home/Self Care  In-House Referral:  Clinical Social Work, Scientist, research (medical)  CM Consult, [facility], Follow-up appt scheduled, Medication Assistance  Post Acute Care Choice:  NA Choice offered to:  NA  DME Arranged:  N/A DME Agency:  NA  HH Arranged:  NA HH Agency:  NA  Status of Service:  Completed, signed off  If discussed at [employer], dates discussed:    Additional Comments: [id number]  04-23-17 Jacqlyn Krauss, RN,BSN [phone]  CM did provide pt with 30 day free Brilinta card for self pay patient,.CM did call the [facility] and appointment placed on AVS. CM to bring bus pass in am. No further needs from CM at this time.  Bethena Roys, RN 04/23/2017, 1:24 PM

## 2017-04-23 NOTE — Progress Notes (Signed)
ANTICOAGULATION CONSULT NOTE - Follow Up Consult  Pharmacy Consult for Heparin Indication: chest pain/ACS  Allergies  Allergen Reactions  . Contrast Media [Iodinated Diagnostic Agents] Other (See Comments)    Unknown.    Patient Measurements: Height: 6' (182.9 cm) Weight: 162 lb 6.4 oz (73.7 kg) IBW/kg (Calculated) : 77.6  Vital Signs: Temp: 98.8 F (37.1 C) (06/25 0743) Temp Source: Oral (06/25 0743) BP: 110/65 (06/25 0743) Pulse Rate: 58 (06/25 0743)  Labs:  Recent Labs  04/21/17 1059 04/21/17 1536  04/21/17 2111  04/22/17 0432 04/22/17 0933 04/22/17 1600 04/23/17 0527  HGB 14.8  --   --   --   --  13.6  --   --  12.6*  HCT 42.7  --   --   --   --  39.6  --   --  37.1*  PLT 216  --   --   --   --  196  --   --  188  HEPARINUNFRC  --   --   < >  --   < >  --  0.61 0.46 0.28*  CREATININE 1.26*  --   --   --   --   --   --   --   --   TROPONINI  --  3.36*  --  3.69*  --  3.99*  --   --   --   < > = values in this interval not displayed.  Estimated Creatinine Clearance: 63.4 mL/min (A) (by C-G formula based on SCr of 1.26 mg/dL (H)).    Assessment: 61yom continues on heparin for CP Heparin level this AM = 0.28   Goal of Therapy:  Heparin level 0.3-0.7 units/ml Monitor platelets by anticoagulation protocol: Yes   Plan:  Heparin to 1300 units / hr Follow up after cath  Thank you Anette Guarneri, PharmD [phone] 04/23/2017,8:16 AM
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
## 2017-04-23 NOTE — Interval H&P Note (Signed)
Cath Lab Visit (complete for each Cath Lab visit)  Clinical Evaluation Leading to the Procedure:   ACS: Yes.    Non-ACS:    Anginal Classification: CCS III  Anti-ischemic medical therapy: No Therapy  Non-Invasive Test Results: No non-invasive testing performed  Prior CABG: No previous CABG      History and Physical Interval Note:  04/23/2017 10:21 AM  Austin Payne  has presented today for surgery, with the diagnosis of NSTEMI  The various methods of treatment have been discussed with the patient and family. After consideration of risks, benefits and other options for treatment, the patient has consented to  Procedure(s): Left Heart Cath and Coronary Angiography (N/A) as a surgical intervention .  The patient's history has been reviewed, patient examined, no change in status, stable for surgery.  I have reviewed the patient's chart and labs.  Questions were answered to the patient's satisfaction.     [name]

## 2017-04-24 ENCOUNTER — Telehealth: Payer: Self-pay

## 2017-04-24 LAB — CBC
HCT: 36.4 % — ABNORMAL LOW (ref 39.0–52.0)
Hemoglobin: 12.2 g/dL — ABNORMAL LOW (ref 13.0–17.0)
MCH: 30 pg (ref 26.0–34.0)
MCHC: 33.5 g/dL (ref 30.0–36.0)
MCV: 89.7 fL (ref 78.0–100.0)
PLATELETS: 203 10*3/uL (ref 150–400)
RBC: 4.06 MIL/uL — ABNORMAL LOW (ref 4.22–5.81)
RDW: 13.2 % (ref 11.5–15.5)
WBC: 15.5 10*3/uL — AB (ref 4.0–10.5)

## 2017-04-24 LAB — BASIC METABOLIC PANEL
Anion gap: 6 (ref 5–15)
BUN: 18 mg/dL (ref 6–20)
CALCIUM: 9 mg/dL (ref 8.9–10.3)
CO2: 23 mmol/L (ref 22–32)
Chloride: 104 mmol/L (ref 101–111)
Creatinine, Ser: 1 mg/dL (ref 0.61–1.24)
Glucose, Bld: 119 mg/dL — ABNORMAL HIGH (ref 65–99)
Potassium: 4.2 mmol/L (ref 3.5–5.1)
SODIUM: 133 mmol/L — AB (ref 135–145)

## 2017-04-24 MED ORDER — ASPIRIN 81 MG PO CHEW: 81.0000 mg | CHEWABLE_TABLET | Freq: Every day | ORAL | Status: AC

## 2017-04-24 MED ORDER — TICAGRELOR 90 MG PO TABS: 90.0000 mg | ORAL_TABLET | Freq: Two times a day (BID) | ORAL | 0 refills | Status: DC

## 2017-04-24 MED ORDER — TICAGRELOR 90 MG PO TABS: 90.0000 mg | ORAL_TABLET | Freq: Two times a day (BID) | ORAL | 10 refills | Status: AC

## 2017-04-24 MED ORDER — NITROGLYCERIN 0.4 MG SL SUBL: 0.4000 mg | SUBLINGUAL_TABLET | SUBLINGUAL | 0 refills | Status: AC | PRN

## 2017-04-24 MED ORDER — ATORVASTATIN CALCIUM 80 MG PO TABS: 80.0000 mg | ORAL_TABLET | Freq: Every day | ORAL | 0 refills | Status: AC

## 2017-04-24 MED ORDER — METOPROLOL TARTRATE 25 MG PO TABS: 12.5000 mg | ORAL_TABLET | Freq: Two times a day (BID) | ORAL | 1 refills | Status: DC

## 2017-04-24 MED FILL — Heparin Sodium (Porcine) Inj 1000 Unit/ML: INTRAMUSCULAR | Qty: 10 | Status: AC

## 2017-04-24 NOTE — Progress Notes (Signed)
Reviewed Brilinta card w patient, [facility] location, pharmacy,  and follow up apt. Patient assist completed by MD, sent w patient to complete and turn in asap once home. Patient understands to submit asap and to contact [place] office when he runs low on Brilinta for samples or different Rx that is more affordable.

## 2017-04-24 NOTE — Progress Notes (Signed)
TR BAND REMOVAL  LOCATION:    right radial  DEFLATED PER PROTOCOL:    Yes.    TIME BAND OFF / DRESSING APPLIED:    2100     SITE UPON ARRIVAL:    Level 0  SITE AFTER BAND REMOVAL:    Level 0  CIRCULATION SENSATION AND MOVEMENT:    Within Normal Limits   Yes.    COMMENTS:   Radial site teaching reinforced. Teach back done.

## 2017-04-24 NOTE — Telephone Encounter (Signed)
Call placed to patient for TCM follow up.  Call went to VM.  Left generic message requesting a call back.  This nurse name and # left.

## 2017-04-24 NOTE — Telephone Encounter (Signed)
Created in error -duplicate

## 2017-04-24 NOTE — Progress Notes (Signed)
CARDIAC REHAB PHASE I   PRE:  Rate/Rhythm: 76 SR    BP: sitting 128/58    SaO2:   MODE:  Ambulation: 460 ft   POST:  Rate/Rhythm: 83 SR    BP: sitting 149/68     SaO2:   Tolerated well, no c/o. VSS. Ed completed. Pt is planning to quit smoking and wants to wear nicotine patch. Discussed tips and methods to quit smoking and resources given. He understands the importance of Brilinta/ASA. Will refer to [place], ACSM 04/24/2017 9:15 AM

## 2017-04-26 NOTE — Telephone Encounter (Signed)
2nd outreach to patient for TCM follow up.  Left details of f/u appt with Bonney Leitz 05/08/2017 @ 1500 at [facility].  Requested call back to this nurse if patient has any questions or concerns.  Left this nurse name and # for call back.
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
## 2017-05-08 ENCOUNTER — Ambulatory Visit: Payer: Self-pay | Admitting: Physician Assistant

## 2017-05-09 ENCOUNTER — Encounter: Payer: Self-pay | Admitting: Physician Assistant

## 2017-05-10 ENCOUNTER — Inpatient Hospital Stay (INDEPENDENT_AMBULATORY_CARE_PROVIDER_SITE_OTHER): Payer: Self-pay | Admitting: Physician Assistant

## 2017-05-10 NOTE — Progress Notes (Deleted)
.  SSCP for 3 day prior to admission. Worse the day of admission. No radiation , palpitations, dyspnea or syncope. Pain helped by nitro in ER istat Troponin 4.23 ECG inferolateral ST depression hyperacute anterior T waves no ST elevation. Given presentation was admitted for work up.   Ost Cx to Prox Cx lesion, 99 %stenosed. Successful proximal circumflex PCI and drug-eluting stenting in the setting of non-STEMI.

## 2017-08-29 ENCOUNTER — Encounter (HOSPITAL_COMMUNITY): Payer: Self-pay | Admitting: *Deleted

## 2017-08-29 ENCOUNTER — Emergency Department (HOSPITAL_COMMUNITY)
Admission: EM | Admit: 2017-08-29 | Discharge: 2017-08-29 | Disposition: A | Discharge status: Home / Self Care | Payer: Self-pay | Attending: Emergency Medicine | Admitting: Emergency Medicine

## 2017-08-29 DIAGNOSIS — R21 Rash and other nonspecific skin eruption: Secondary | ICD-10-CM | POA: Insufficient documentation

## 2017-08-29 DIAGNOSIS — Z5321 Procedure and treatment not carried out due to patient leaving prior to being seen by health care provider: Secondary | ICD-10-CM | POA: Insufficient documentation
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
## 2017-08-30 ENCOUNTER — Emergency Department (HOSPITAL_COMMUNITY)
Admission: EM | Admit: 2017-08-30 | Discharge: 2017-08-30 | Disposition: A | Discharge status: Home / Self Care | Payer: Self-pay | Attending: Emergency Medicine | Admitting: Emergency Medicine

## 2017-08-30 ENCOUNTER — Encounter (HOSPITAL_COMMUNITY): Payer: Self-pay

## 2017-08-30 DIAGNOSIS — I252 Old myocardial infarction: Secondary | ICD-10-CM | POA: Insufficient documentation

## 2017-08-30 DIAGNOSIS — T2027XA Burn of second degree of neck, initial encounter: Secondary | ICD-10-CM | POA: Insufficient documentation

## 2017-08-30 DIAGNOSIS — Y998 Other external cause status: Secondary | ICD-10-CM | POA: Insufficient documentation

## 2017-08-30 DIAGNOSIS — T551X1A Toxic effect of detergents, accidental (unintentional), initial encounter: Secondary | ICD-10-CM | POA: Insufficient documentation

## 2017-08-30 DIAGNOSIS — T2067XA Corrosion of second degree of neck, initial encounter: Secondary | ICD-10-CM | POA: Insufficient documentation

## 2017-08-30 DIAGNOSIS — Z7982 Long term (current) use of aspirin: Secondary | ICD-10-CM | POA: Insufficient documentation

## 2017-08-30 DIAGNOSIS — Y929 Unspecified place or not applicable: Secondary | ICD-10-CM | POA: Insufficient documentation

## 2017-08-30 DIAGNOSIS — Y9389 Activity, other specified: Secondary | ICD-10-CM | POA: Insufficient documentation

## 2017-08-30 DIAGNOSIS — Z79899 Other long term (current) drug therapy: Secondary | ICD-10-CM | POA: Insufficient documentation

## 2017-08-30 DIAGNOSIS — X58XXXA Exposure to other specified factors, initial encounter: Secondary | ICD-10-CM | POA: Insufficient documentation

## 2017-08-30 DIAGNOSIS — Z23 Encounter for immunization: Secondary | ICD-10-CM | POA: Insufficient documentation

## 2017-08-30 DIAGNOSIS — I10 Essential (primary) hypertension: Secondary | ICD-10-CM | POA: Insufficient documentation

## 2017-08-30 MED ORDER — SILVER SULFADIAZINE 1 % EX CREA
TOPICAL_CREAM | Freq: Once | CUTANEOUS | Status: AC
  Administered 2017-08-30: 19:00:00 via TOPICAL
  Filled 2017-08-30: qty 85

## 2017-08-30 MED ORDER — TETANUS-DIPHTH-ACELL PERTUSSIS 5-2.5-18.5 LF-MCG/0.5 IM SUSP
0.5000 mL | Freq: Once | INTRAMUSCULAR | Status: AC
  Administered 2017-08-30: 0.5 mL via INTRAMUSCULAR
  Filled 2017-08-30: qty 0.5

## 2017-08-30 MED ORDER — SILVER SULFADIAZINE 1 % EX CREA: 1.0000 "application " | TOPICAL_CREAM | Freq: Every day | CUTANEOUS | 0 refills | Status: DC

## 2017-08-30 NOTE — Discharge Instructions (Signed)
I think you likely need a skin graft for this.  Please follow-up with the plastic surgeon.  Return for fever or spreading redness bleeding.
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
## 2017-08-30 NOTE — ED Notes (Signed)
Pt verbalized understanding discharge instructions and denies any further needs or questions at this time. VS stable, ambulatory and steady gait.   

## 2018-05-07 ENCOUNTER — Other Ambulatory Visit: Payer: Self-pay

## 2018-05-07 ENCOUNTER — Encounter (HOSPITAL_COMMUNITY): Payer: Self-pay

## 2018-05-07 ENCOUNTER — Emergency Department (HOSPITAL_COMMUNITY)
Admission: EM | Admit: 2018-05-07 | Discharge: 2018-05-07 | Disposition: A | Discharge status: Home / Self Care | Payer: Self-pay | Attending: Emergency Medicine | Admitting: Emergency Medicine

## 2018-05-07 DIAGNOSIS — Z7982 Long term (current) use of aspirin: Secondary | ICD-10-CM | POA: Insufficient documentation

## 2018-05-07 DIAGNOSIS — I252 Old myocardial infarction: Secondary | ICD-10-CM | POA: Insufficient documentation

## 2018-05-07 DIAGNOSIS — I10 Essential (primary) hypertension: Secondary | ICD-10-CM | POA: Insufficient documentation

## 2018-05-07 DIAGNOSIS — S1190XD Unspecified open wound of unspecified part of neck, subsequent encounter: Secondary | ICD-10-CM | POA: Insufficient documentation

## 2018-05-07 DIAGNOSIS — Y33XXXA Other specified events, undetermined intent, initial encounter: Secondary | ICD-10-CM | POA: Insufficient documentation

## 2018-05-07 DIAGNOSIS — Z79899 Other long term (current) drug therapy: Secondary | ICD-10-CM | POA: Insufficient documentation

## 2018-05-07 MED ORDER — CEPHALEXIN 500 MG PO CAPS: 500.0000 mg | ORAL_CAPSULE | Freq: Four times a day (QID) | ORAL | 0 refills | Status: DC

## 2018-05-07 MED ORDER — TRAMADOL HCL 50 MG PO TABS: 50.0000 mg | ORAL_TABLET | Freq: Four times a day (QID) | ORAL | 0 refills | Status: AC | PRN
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
## 2018-05-07 NOTE — ED Notes (Signed)
Patient verbalized understanding of discharge instructions, no questions. Patient ambulated out of ED with steady gait in no distress.  
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
## 2018-05-07 NOTE — Discharge Instructions (Addendum)
Do wet to dry dressings as instructed. Wound needs to be cleaned daily with mild soap and warm water before being redressed. Redress wound if bandages become dirty. You really need to follow-up with surgery. Follow-up with one of the above contacts.I think for this wound to heal well, you need debridement (cutting away of dead/unhealthy tissue).

## 2018-05-08 ENCOUNTER — Telehealth: Payer: Self-pay

## 2018-05-08 NOTE — Telephone Encounter (Signed)
Message received from Haze Justin, RN CM requesting a hospital follow up appointment for the patient @ [facility].   Attempted to contact him to schedule an appointment as there is one available 05/21/18 @ 1330.  Call placed to # [phone], listed as home # and the person who answer said it was the wrong #.  Call placed to # [phone] and a HIPAA compliant voicemail message was left requesting a call back to # [phone]. The appointment may not be available when he calls back and he will then need to call the clinic to check appointment availability.  He can also call [facility] to check for appointments.   Update provided to Orpha Bur, RN CM

## 2018-05-13 ENCOUNTER — Telehealth: Payer: Self-pay

## 2018-05-13 NOTE — Telephone Encounter (Signed)
Attempted to contact the patient to schedule a hospital follow up appointment at [facility] as an appointment is available 05/28/18.  The appointment may/may not be available when/if the patient calls back.  Call placed to # [phone] and a HIPAA compliant voicemail message was left requesting a call back to # [phone].

## 2018-09-06 ENCOUNTER — Other Ambulatory Visit: Payer: Self-pay

## 2018-09-06 ENCOUNTER — Inpatient Hospital Stay (HOSPITAL_COMMUNITY)
Admission: EM | Admit: 2018-09-06 | Discharge: 2018-10-10 | DRG: 576 | Disposition: A | Discharge status: Skilled Nursing Facility | Payer: Medicaid Other | Attending: Internal Medicine | Admitting: Internal Medicine

## 2018-09-06 DIAGNOSIS — K0889 Other specified disorders of teeth and supporting structures: Secondary | ICD-10-CM | POA: Diagnosis not present

## 2018-09-06 DIAGNOSIS — Z79899 Other long term (current) drug therapy: Secondary | ICD-10-CM

## 2018-09-06 DIAGNOSIS — T6591XD Toxic effect of unspecified substance, accidental (unintentional), subsequent encounter: Secondary | ICD-10-CM | POA: Diagnosis not present

## 2018-09-06 DIAGNOSIS — T148XXA Other injury of unspecified body region, initial encounter: Secondary | ICD-10-CM

## 2018-09-06 DIAGNOSIS — Z590 Homelessness: Secondary | ICD-10-CM

## 2018-09-06 DIAGNOSIS — L989 Disorder of the skin and subcutaneous tissue, unspecified: Secondary | ICD-10-CM

## 2018-09-06 DIAGNOSIS — I252 Old myocardial infarction: Secondary | ICD-10-CM | POA: Diagnosis not present

## 2018-09-06 DIAGNOSIS — M8448XD Pathological fracture, other site, subsequent encounter for fracture with routine healing: Secondary | ICD-10-CM | POA: Diagnosis not present

## 2018-09-06 DIAGNOSIS — C7801 Secondary malignant neoplasm of right lung: Secondary | ICD-10-CM | POA: Diagnosis present

## 2018-09-06 DIAGNOSIS — T2047XD Corrosion of unspecified degree of neck, subsequent encounter: Secondary | ICD-10-CM

## 2018-09-06 DIAGNOSIS — E876 Hypokalemia: Secondary | ICD-10-CM | POA: Diagnosis present

## 2018-09-06 DIAGNOSIS — Z955 Presence of coronary angioplasty implant and graft: Secondary | ICD-10-CM

## 2018-09-06 DIAGNOSIS — E43 Unspecified severe protein-calorie malnutrition: Secondary | ICD-10-CM | POA: Diagnosis present

## 2018-09-06 DIAGNOSIS — R5381 Other malaise: Secondary | ICD-10-CM | POA: Diagnosis not present

## 2018-09-06 DIAGNOSIS — Z66 Do not resuscitate: Secondary | ICD-10-CM | POA: Diagnosis present

## 2018-09-06 DIAGNOSIS — Z7401 Bed confinement status: Secondary | ICD-10-CM

## 2018-09-06 DIAGNOSIS — D519 Vitamin B12 deficiency anemia, unspecified: Secondary | ICD-10-CM | POA: Diagnosis not present

## 2018-09-06 DIAGNOSIS — T2047XS Corrosion of unspecified degree of neck, sequela: Secondary | ICD-10-CM | POA: Diagnosis not present

## 2018-09-06 DIAGNOSIS — D509 Iron deficiency anemia, unspecified: Secondary | ICD-10-CM | POA: Diagnosis present

## 2018-09-06 DIAGNOSIS — E785 Hyperlipidemia, unspecified: Secondary | ICD-10-CM | POA: Diagnosis present

## 2018-09-06 DIAGNOSIS — M4802 Spinal stenosis, cervical region: Secondary | ICD-10-CM | POA: Diagnosis present

## 2018-09-06 DIAGNOSIS — I10 Essential (primary) hypertension: Secondary | ICD-10-CM | POA: Diagnosis present

## 2018-09-06 DIAGNOSIS — C7802 Secondary malignant neoplasm of left lung: Secondary | ICD-10-CM | POA: Diagnosis present

## 2018-09-06 DIAGNOSIS — Y848 Other medical procedures as the cause of abnormal reaction of the patient, or of later complication, without mention of misadventure at the time of the procedure: Secondary | ICD-10-CM | POA: Diagnosis not present

## 2018-09-06 DIAGNOSIS — Z681 Body mass index (BMI) 19 or less, adult: Secondary | ICD-10-CM

## 2018-09-06 DIAGNOSIS — Z9889 Other specified postprocedural states: Secondary | ICD-10-CM | POA: Diagnosis not present

## 2018-09-06 DIAGNOSIS — K5900 Constipation, unspecified: Secondary | ICD-10-CM | POA: Diagnosis not present

## 2018-09-06 DIAGNOSIS — R0989 Other specified symptoms and signs involving the circulatory and respiratory systems: Secondary | ICD-10-CM | POA: Diagnosis not present

## 2018-09-06 DIAGNOSIS — Z7189 Other specified counseling: Secondary | ICD-10-CM | POA: Diagnosis not present

## 2018-09-06 DIAGNOSIS — Z91041 Radiographic dye allergy status: Secondary | ICD-10-CM

## 2018-09-06 DIAGNOSIS — M8448XA Pathological fracture, other site, initial encounter for fracture: Secondary | ICD-10-CM | POA: Diagnosis not present

## 2018-09-06 DIAGNOSIS — B888 Other specified infestations: Secondary | ICD-10-CM | POA: Diagnosis present

## 2018-09-06 DIAGNOSIS — E878 Other disorders of electrolyte and fluid balance, not elsewhere classified: Secondary | ICD-10-CM | POA: Diagnosis not present

## 2018-09-06 DIAGNOSIS — R338 Other retention of urine: Secondary | ICD-10-CM | POA: Diagnosis not present

## 2018-09-06 DIAGNOSIS — Z5329 Procedure and treatment not carried out because of patient's decision for other reasons: Secondary | ICD-10-CM | POA: Diagnosis not present

## 2018-09-06 DIAGNOSIS — R221 Localized swelling, mass and lump, neck: Secondary | ICD-10-CM

## 2018-09-06 DIAGNOSIS — C799 Secondary malignant neoplasm of unspecified site: Secondary | ICD-10-CM | POA: Diagnosis present

## 2018-09-06 DIAGNOSIS — R0902 Hypoxemia: Secondary | ICD-10-CM | POA: Diagnosis present

## 2018-09-06 DIAGNOSIS — R252 Cramp and spasm: Secondary | ICD-10-CM | POA: Diagnosis not present

## 2018-09-06 DIAGNOSIS — R4182 Altered mental status, unspecified: Secondary | ICD-10-CM | POA: Diagnosis not present

## 2018-09-06 DIAGNOSIS — R5084 Febrile nonhemolytic transfusion reaction: Secondary | ICD-10-CM | POA: Diagnosis not present

## 2018-09-06 DIAGNOSIS — E871 Hypo-osmolality and hyponatremia: Secondary | ICD-10-CM | POA: Diagnosis present

## 2018-09-06 DIAGNOSIS — C4442 Squamous cell carcinoma of skin of scalp and neck: Secondary | ICD-10-CM | POA: Diagnosis present

## 2018-09-06 DIAGNOSIS — R369 Urethral discharge, unspecified: Secondary | ICD-10-CM | POA: Diagnosis not present

## 2018-09-06 DIAGNOSIS — M79604 Pain in right leg: Secondary | ICD-10-CM | POA: Diagnosis not present

## 2018-09-06 DIAGNOSIS — C7951 Secondary malignant neoplasm of bone: Secondary | ICD-10-CM | POA: Diagnosis present

## 2018-09-06 DIAGNOSIS — Z960 Presence of urogenital implants: Secondary | ICD-10-CM | POA: Diagnosis not present

## 2018-09-06 DIAGNOSIS — T402X5A Adverse effect of other opioids, initial encounter: Secondary | ICD-10-CM | POA: Diagnosis not present

## 2018-09-06 DIAGNOSIS — Z7982 Long term (current) use of aspirin: Secondary | ICD-10-CM

## 2018-09-06 DIAGNOSIS — D649 Anemia, unspecified: Secondary | ICD-10-CM | POA: Diagnosis not present

## 2018-09-06 DIAGNOSIS — I2510 Atherosclerotic heart disease of native coronary artery without angina pectoris: Secondary | ICD-10-CM

## 2018-09-06 DIAGNOSIS — F17200 Nicotine dependence, unspecified, uncomplicated: Secondary | ICD-10-CM

## 2018-09-06 DIAGNOSIS — C801 Malignant (primary) neoplasm, unspecified: Secondary | ICD-10-CM | POA: Diagnosis not present

## 2018-09-06 DIAGNOSIS — R633 Feeding difficulties: Secondary | ICD-10-CM | POA: Diagnosis not present

## 2018-09-06 DIAGNOSIS — Z9119 Patient's noncompliance with other medical treatment and regimen: Secondary | ICD-10-CM | POA: Diagnosis not present

## 2018-09-06 DIAGNOSIS — D62 Acute posthemorrhagic anemia: Secondary | ICD-10-CM | POA: Diagnosis present

## 2018-09-06 DIAGNOSIS — C760 Malignant neoplasm of head, face and neck: Secondary | ICD-10-CM | POA: Diagnosis not present

## 2018-09-06 DIAGNOSIS — G9341 Metabolic encephalopathy: Secondary | ICD-10-CM | POA: Diagnosis present

## 2018-09-06 DIAGNOSIS — R339 Retention of urine, unspecified: Secondary | ICD-10-CM | POA: Diagnosis not present

## 2018-09-06 DIAGNOSIS — E538 Deficiency of other specified B group vitamins: Secondary | ICD-10-CM | POA: Diagnosis present

## 2018-09-06 DIAGNOSIS — C7800 Secondary malignant neoplasm of unspecified lung: Secondary | ICD-10-CM | POA: Diagnosis not present

## 2018-09-06 DIAGNOSIS — L8990 Pressure ulcer of unspecified site, unspecified stage: Secondary | ICD-10-CM | POA: Diagnosis present

## 2018-09-06 DIAGNOSIS — T8089XA Other complications following infusion, transfusion and therapeutic injection, initial encounter: Secondary | ICD-10-CM | POA: Diagnosis not present

## 2018-09-06 DIAGNOSIS — M8458XA Pathological fracture in neoplastic disease, other specified site, initial encounter for fracture: Secondary | ICD-10-CM | POA: Diagnosis present

## 2018-09-06 DIAGNOSIS — R454 Irritability and anger: Secondary | ICD-10-CM | POA: Diagnosis not present

## 2018-09-06 DIAGNOSIS — L089 Local infection of the skin and subcutaneous tissue, unspecified: Secondary | ICD-10-CM

## 2018-09-06 DIAGNOSIS — D638 Anemia in other chronic diseases classified elsewhere: Secondary | ICD-10-CM | POA: Diagnosis present

## 2018-09-06 DIAGNOSIS — Z515 Encounter for palliative care: Secondary | ICD-10-CM

## 2018-09-06 DIAGNOSIS — R918 Other nonspecific abnormal finding of lung field: Secondary | ICD-10-CM | POA: Diagnosis not present

## 2018-09-06 DIAGNOSIS — M79605 Pain in left leg: Secondary | ICD-10-CM | POA: Diagnosis not present

## 2018-09-06 DIAGNOSIS — F17210 Nicotine dependence, cigarettes, uncomplicated: Secondary | ICD-10-CM | POA: Diagnosis present

## 2018-09-06 DIAGNOSIS — J90 Pleural effusion, not elsewhere classified: Secondary | ICD-10-CM | POA: Diagnosis not present

## 2018-09-06 DIAGNOSIS — I700 Atherosclerosis of aorta: Secondary | ICD-10-CM | POA: Diagnosis present

## 2018-09-06 DIAGNOSIS — IMO0002 Reserved for concepts with insufficient information to code with codable children: Secondary | ICD-10-CM | POA: Diagnosis present

## 2018-09-06 DIAGNOSIS — M4302 Spondylolysis, cervical region: Secondary | ICD-10-CM | POA: Diagnosis present

## 2018-09-06 LAB — CBC WITH DIFFERENTIAL/PLATELET
BASOS ABS: 0 10*3/uL (ref 0.0–0.1)
Band Neutrophils: 1 %
Basophils Relative: 0 %
EOS PCT: 0 %
Eosinophils Absolute: 0 10*3/uL (ref 0.0–0.5)
HCT: 31.6 % — ABNORMAL LOW (ref 39.0–52.0)
HEMOGLOBIN: 10.2 g/dL — AB (ref 13.0–17.0)
LYMPHS PCT: 20 %
Lymphs Abs: 2.2 10*3/uL (ref 0.7–4.0)
MCH: 27 pg (ref 26.0–34.0)
MCHC: 32.3 g/dL (ref 30.0–36.0)
MCV: 83.6 fL (ref 80.0–100.0)
METAMYELOCYTES PCT: 2 %
MONO ABS: 0.4 10*3/uL (ref 0.1–1.0)
MONOS PCT: 4 %
Neutro Abs: 8.5 10*3/uL — ABNORMAL HIGH (ref 1.7–7.7)
Neutrophils Relative %: 72 %
PLATELETS: 176 10*3/uL (ref 150–400)
Promyelocytes Relative: 1 %
RBC: 3.78 MIL/uL — ABNORMAL LOW (ref 4.22–5.81)
RDW: 14.7 % (ref 11.5–15.5)
WBC: 11.2 10*3/uL — ABNORMAL HIGH (ref 4.0–10.5)
nRBC: 0 /100 WBC
nRBC: 0.4 % — ABNORMAL HIGH (ref 0.0–0.2)

## 2018-09-06 LAB — COMPREHENSIVE METABOLIC PANEL
ALBUMIN: 3.1 g/dL — AB (ref 3.5–5.0)
ALT: 8 U/L (ref 0–44)
ANION GAP: 11 (ref 5–15)
AST: 34 U/L (ref 15–41)
Alkaline Phosphatase: 70 U/L (ref 38–126)
BUN: 22 mg/dL (ref 8–23)
CHLORIDE: 98 mmol/L (ref 98–111)
CO2: 26 mmol/L (ref 22–32)
Calcium: 14.1 mg/dL (ref 8.9–10.3)
Creatinine, Ser: 1.3 mg/dL — ABNORMAL HIGH (ref 0.61–1.24)
GFR calc Af Amer: >60 mL/min (ref >60)
GFR calc non Af Amer: 57 mL/min — ABNORMAL LOW (ref >60)
GLUCOSE: 118 mg/dL — AB (ref 70–99)
Potassium: 3.2 mmol/L — ABNORMAL LOW (ref 3.5–5.1)
SODIUM: 135 mmol/L (ref 135–145)
Total Bilirubin: 1.1 mg/dL (ref 0.3–1.2)
Total Protein: 7.3 g/dL (ref 6.5–8.1)

## 2018-09-06 LAB — I-STAT CG4 LACTIC ACID, ED
Lactic Acid, Venous: 3.01 mmol/L (ref 0.5–1.9)
Lactic Acid, Venous: 1.96 mmol/L — ABNORMAL HIGH (ref 0.5–1.9)

## 2018-09-06 LAB — LIPASE, BLOOD: LIPASE: 28 U/L (ref 11–51)

## 2018-09-06 LAB — MRSA PCR SCREENING: MRSA by PCR: NEGATIVE

## 2018-09-06 MED ORDER — VANCOMYCIN HCL 500 MG IV SOLR
500.0000 mg | Freq: Two times a day (BID) | INTRAVENOUS | Status: DC
  Administered 2018-09-07: 500 mg via INTRAVENOUS
  Filled 2018-09-06 (×2): qty 500

## 2018-09-06 MED ORDER — ASPIRIN 81 MG PO CHEW
81.0000 mg | CHEWABLE_TABLET | Freq: Every day | ORAL | Status: DC
  Administered 2018-09-07 – 2018-10-17 (×41): 81 mg via ORAL
  Filled 2018-09-06 (×43): qty 1

## 2018-09-06 MED ORDER — VANCOMYCIN HCL IN DEXTROSE 1-5 GM/200ML-% IV SOLN
1000.0000 mg | Freq: Once | INTRAVENOUS | Status: AC
  Administered 2018-09-06: 1000 mg via INTRAVENOUS
  Filled 2018-09-06: qty 200

## 2018-09-06 MED ORDER — POTASSIUM CHLORIDE 10 MEQ/100ML IV SOLN
10.0000 meq | INTRAVENOUS | Status: AC
  Administered 2018-09-06 – 2018-09-07 (×4): 10 meq via INTRAVENOUS
  Filled 2018-09-06 (×4): qty 100

## 2018-09-06 MED ORDER — ZOLEDRONIC ACID 4 MG/5ML IV CONC
4.0000 mg | Freq: Once | INTRAVENOUS | Status: AC
  Administered 2018-09-07: 4 mg via INTRAVENOUS
  Filled 2018-09-06: qty 5

## 2018-09-06 MED ORDER — PIPERACILLIN-TAZOBACTAM 3.375 G IVPB 30 MIN
3.3750 g | Freq: Once | INTRAVENOUS | Status: AC
  Administered 2018-09-06: 3.375 g via INTRAVENOUS
  Filled 2018-09-06 (×2): qty 50

## 2018-09-06 MED ORDER — PIPERACILLIN-TAZOBACTAM 3.375 G IVPB
3.3750 g | Freq: Three times a day (TID) | INTRAVENOUS | Status: DC
  Administered 2018-09-06 – 2018-09-07 (×3): 3.375 g via INTRAVENOUS
  Filled 2018-09-06 (×3): qty 50

## 2018-09-06 MED ORDER — SODIUM CHLORIDE 0.9 % IV BOLUS
500.0000 mL | Freq: Once | INTRAVENOUS | Status: AC
  Administered 2018-09-06: 500 mL via INTRAVENOUS

## 2018-09-06 MED ORDER — SODIUM CHLORIDE 0.9 % IV SOLN
INTRAVENOUS | Status: DC
  Administered 2018-09-07 – 2018-09-09 (×7): via INTRAVENOUS

## 2018-09-06 MED ORDER — HEPARIN SODIUM (PORCINE) 5000 UNIT/ML IJ SOLN
5000.0000 [IU] | Freq: Three times a day (TID) | INTRAMUSCULAR | Status: DC
  Administered 2018-09-07 – 2018-09-29 (×63): 5000 [IU] via SUBCUTANEOUS
  Filled 2018-09-06 (×65): qty 1

## 2018-09-06 MED ORDER — SODIUM CHLORIDE 0.9 % IV BOLUS
1000.0000 mL | Freq: Once | INTRAVENOUS | Status: AC
  Administered 2018-09-06: 1000 mL via INTRAVENOUS

## 2018-09-06 MED ORDER — SODIUM CHLORIDE 0.9 % IV BOLUS
1000.0000 mL | Freq: Once | INTRAVENOUS | Status: AC
  Administered 2018-09-07: 1000 mL via INTRAVENOUS

## 2018-09-06 NOTE — ED Notes (Signed)
ED Provider at bedside. 
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
## 2018-09-06 NOTE — Progress Notes (Signed)
Pharmacy Antibiotic Note  Austin Payne is a 62 y.o. male admitted on 09/06/2018 with wound infection. Pharmacy has been consulted for vancomycin and zosyn dosing. Pt is afebrile and WBC is mildly elevated at 11.2. Lactic acid is elevated at 3.01. Scr is elevated at 1.3.   Plan: Vanc 1gm IV x 1 then 500mg  IV Q12H  Zosyn 3.375gm IV Q8H (4 hr inf) F/u renal fxn, C&S, clinical status and trough at SS  Height: 6' (182.9 cm) Weight: 147 lb (66.7 kg) IBW/kg (Calculated) : 77.6  No data recorded.  No results for input(s): WBC, CREATININE, LATICACIDVEN, VANCOTROUGH, VANCOPEAK, VANCORANDOM, GENTTROUGH, GENTPEAK, GENTRANDOM, TOBRATROUGH, TOBRAPEAK, TOBRARND, AMIKACINPEAK, AMIKACINTROU, AMIKACIN in the last 168 hours.  CrCl cannot be calculated (Patient's most recent lab result is older than the maximum 21 days allowed.).    Allergies  Allergen Reactions  . Contrast Media [Iodinated Diagnostic Agents] Other (See Comments)    Unknown.    Antimicrobials this admission: Vanc 11/8>> Zosyn 11/8>>  Dose adjustments this admission: N/A  Microbiology results: Pending  Thank you for allowing pharmacy to be a part of this patient's care.  Callie Facey, Rande Lawman 09/06/2018 12:38 PM

## 2018-09-06 NOTE — ED Notes (Signed)
Admitting resident paged to verify stepdown assignment per 6E. Awaiting call-back from admitting team.

## 2018-09-06 NOTE — ED Notes (Signed)
Pt given blanket.
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
## 2018-09-07 ENCOUNTER — Inpatient Hospital Stay (HOSPITAL_COMMUNITY): Payer: Medicaid Other

## 2018-09-07 DIAGNOSIS — R918 Other nonspecific abnormal finding of lung field: Secondary | ICD-10-CM | POA: Diagnosis present

## 2018-09-07 LAB — URINALYSIS, ROUTINE W REFLEX MICROSCOPIC
BACTERIA UA: NONE SEEN
BILIRUBIN URINE: NEGATIVE
Glucose, UA: NEGATIVE mg/dL
KETONES UR: NEGATIVE mg/dL
LEUKOCYTES UA: NEGATIVE
NITRITE: NEGATIVE
PH: 5 (ref 5.0–8.0)
PROTEIN: NEGATIVE mg/dL
Specific Gravity, Urine: 1.014 (ref 1.005–1.030)

## 2018-09-07 LAB — PTH, INTACT AND CALCIUM
CALCIUM TOTAL (PTH): 12.3 mg/dL — AB (ref 8.6–10.2)
PTH: 6 pg/mL — ABNORMAL LOW (ref 15–65)

## 2018-09-07 LAB — COMPREHENSIVE METABOLIC PANEL
ALBUMIN: 2.4 g/dL — AB (ref 3.5–5.0)
ALT: 8 U/L (ref 0–44)
ANION GAP: 9 (ref 5–15)
AST: 29 U/L (ref 15–41)
Alkaline Phosphatase: 55 U/L (ref 38–126)
BUN: 18 mg/dL (ref 8–23)
CALCIUM: 11.8 mg/dL — AB (ref 8.9–10.3)
CO2: 23 mmol/L (ref 22–32)
Chloride: 105 mmol/L (ref 98–111)
Creatinine, Ser: 1.23 mg/dL (ref 0.61–1.24)
GFR calc non Af Amer: >60 mL/min (ref >60)
Glucose, Bld: 95 mg/dL (ref 70–99)
Potassium: 3.6 mmol/L (ref 3.5–5.1)
SODIUM: 137 mmol/L (ref 135–145)
Total Bilirubin: 1.1 mg/dL (ref 0.3–1.2)
Total Protein: 6 g/dL — ABNORMAL LOW (ref 6.5–8.1)

## 2018-09-07 LAB — CBC
HCT: 25.7 % — ABNORMAL LOW (ref 39.0–52.0)
HEMOGLOBIN: 8.5 g/dL — AB (ref 13.0–17.0)
MCH: 27.5 pg (ref 26.0–34.0)
MCHC: 33.1 g/dL (ref 30.0–36.0)
MCV: 83.2 fL (ref 80.0–100.0)
NRBC: 0.5 % — AB (ref 0.0–0.2)
Platelets: 127 10*3/uL — ABNORMAL LOW (ref 150–400)
RBC: 3.09 MIL/uL — AB (ref 4.22–5.81)
RDW: 15 % (ref 11.5–15.5)
WBC: 9.2 10*3/uL (ref 4.0–10.5)

## 2018-09-07 LAB — HIV ANTIBODY (ROUTINE TESTING W REFLEX): HIV Screen 4th Generation wRfx: NONREACTIVE

## 2018-09-07 MED ORDER — METOPROLOL TARTRATE 12.5 MG HALF TABLET
12.5000 mg | ORAL_TABLET | Freq: Two times a day (BID) | ORAL | Status: DC
  Administered 2018-09-07 – 2018-10-16 (×79): 12.5 mg via ORAL
  Filled 2018-09-07 (×82): qty 1

## 2018-09-07 MED ORDER — PREDNISONE 50 MG PO TABS
50.0000 mg | ORAL_TABLET | Freq: Four times a day (QID) | ORAL | Status: AC
  Administered 2018-09-07 – 2018-09-08 (×3): 50 mg via ORAL
  Filled 2018-09-07 (×3): qty 1

## 2018-09-07 MED ORDER — DIPHENHYDRAMINE HCL 25 MG PO CAPS
50.0000 mg | ORAL_CAPSULE | Freq: Once | ORAL | Status: AC
  Administered 2018-09-08: 50 mg via ORAL
  Filled 2018-09-07: qty 2

## 2018-09-07 MED ORDER — DIPHENHYDRAMINE HCL 50 MG/ML IJ SOLN: 50.0000 mg | Freq: Once | INTRAMUSCULAR | Status: AC

## 2018-09-07 NOTE — Progress Notes (Signed)
Pt has an order for Foley catheter placement. He has been urinating, bladder scan showed 105 cc of urine in bladder. Intern on call notified and instructions given to RN to wait for day shift to clarify indication for Foley placement. Will continue to monitor output.

## 2018-09-07 NOTE — Progress Notes (Signed)
Patient has been fluctuating between sinus brady and NSR>
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
## 2018-09-08 ENCOUNTER — Inpatient Hospital Stay (HOSPITAL_COMMUNITY): Payer: Medicaid Other

## 2018-09-08 DIAGNOSIS — E878 Other disorders of electrolyte and fluid balance, not elsewhere classified: Secondary | ICD-10-CM

## 2018-09-08 DIAGNOSIS — G9341 Metabolic encephalopathy: Secondary | ICD-10-CM

## 2018-09-08 DIAGNOSIS — Z590 Homelessness: Secondary | ICD-10-CM

## 2018-09-08 LAB — COMPREHENSIVE METABOLIC PANEL
ALBUMIN: 2.3 g/dL — AB (ref 3.5–5.0)
ALT: 8 U/L (ref 0–44)
ANION GAP: 9 (ref 5–15)
AST: 32 U/L (ref 15–41)
Alkaline Phosphatase: 54 U/L (ref 38–126)
BILIRUBIN TOTAL: 1.1 mg/dL (ref 0.3–1.2)
BUN: 16 mg/dL (ref 8–23)
CO2: 20 mmol/L — ABNORMAL LOW (ref 22–32)
Calcium: 10.6 mg/dL — ABNORMAL HIGH (ref 8.9–10.3)
Chloride: 108 mmol/L (ref 98–111)
Creatinine, Ser: 1.26 mg/dL — ABNORMAL HIGH (ref 0.61–1.24)
GFR calc non Af Amer: 59 mL/min — ABNORMAL LOW (ref >60)
GLUCOSE: 123 mg/dL — AB (ref 70–99)
POTASSIUM: 3.1 mmol/L — AB (ref 3.5–5.1)
Sodium: 137 mmol/L (ref 135–145)
TOTAL PROTEIN: 5.7 g/dL — AB (ref 6.5–8.1)

## 2018-09-08 LAB — RENAL FUNCTION PANEL
ALBUMIN: 2.2 g/dL — AB (ref 3.5–5.0)
Anion gap: 11 (ref 5–15)
BUN: 16 mg/dL (ref 8–23)
CHLORIDE: 107 mmol/L (ref 98–111)
CO2: 20 mmol/L — ABNORMAL LOW (ref 22–32)
CREATININE: 1.11 mg/dL (ref 0.61–1.24)
Calcium: 10 mg/dL (ref 8.9–10.3)
Glucose, Bld: 128 mg/dL — ABNORMAL HIGH (ref 70–99)
PHOSPHORUS: 3.7 mg/dL (ref 2.5–4.6)
Potassium: 3 mmol/L — ABNORMAL LOW (ref 3.5–5.1)
Sodium: 138 mmol/L (ref 135–145)

## 2018-09-08 MED ORDER — IOHEXOL 300 MG/ML  SOLN
75.0000 mL | Freq: Once | INTRAMUSCULAR | Status: AC | PRN
  Administered 2018-09-08: 75 mL via INTRAVENOUS

## 2018-09-08 MED ORDER — DIPHENHYDRAMINE HCL 50 MG/ML IJ SOLN
50.0000 mg | Freq: Once | INTRAMUSCULAR | Status: AC
  Administered 2018-09-08: 50 mg via INTRAVENOUS
  Filled 2018-09-08: qty 1

## 2018-09-08 MED ORDER — POTASSIUM CHLORIDE CRYS ER 20 MEQ PO TBCR
40.0000 meq | EXTENDED_RELEASE_TABLET | Freq: Two times a day (BID) | ORAL | Status: DC
  Administered 2018-09-08 – 2018-09-10 (×6): 40 meq via ORAL
  Filled 2018-09-08 (×6): qty 2
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
## 2018-09-09 DIAGNOSIS — R454 Irritability and anger: Secondary | ICD-10-CM

## 2018-09-09 DIAGNOSIS — E43 Unspecified severe protein-calorie malnutrition: Secondary | ICD-10-CM | POA: Diagnosis present

## 2018-09-09 LAB — RENAL FUNCTION PANEL
Albumin: 2.1 g/dL — ABNORMAL LOW (ref 3.5–5.0)
Anion gap: 8 (ref 5–15)
BUN: 21 mg/dL (ref 8–23)
CO2: 20 mmol/L — ABNORMAL LOW (ref 22–32)
Calcium: 8.8 mg/dL — ABNORMAL LOW (ref 8.9–10.3)
Chloride: 110 mmol/L (ref 98–111)
Creatinine, Ser: 1.04 mg/dL (ref 0.61–1.24)
GFR calc Af Amer: >60 mL/min (ref >60)
GFR calc non Af Amer: >60 mL/min (ref >60)
Glucose, Bld: 116 mg/dL — ABNORMAL HIGH (ref 70–99)
Phosphorus: 1.5 mg/dL — ABNORMAL LOW (ref 2.5–4.6)
Potassium: 3 mmol/L — ABNORMAL LOW (ref 3.5–5.1)
Sodium: 138 mmol/L (ref 135–145)

## 2018-09-09 MED ORDER — ADULT MULTIVITAMIN W/MINERALS CH
1.0000 | ORAL_TABLET | Freq: Every day | ORAL | Status: DC
  Administered 2018-09-09 – 2018-10-16 (×37): 1 via ORAL
  Filled 2018-09-09 (×41): qty 1

## 2018-09-09 MED ORDER — ENSURE ENLIVE PO LIQD
237.0000 mL | Freq: Three times a day (TID) | ORAL | Status: DC
  Administered 2018-09-09 – 2018-10-17 (×97): 237 mL via ORAL
  Filled 2018-09-09: qty 237

## 2018-09-09 MED ORDER — CETAPHIL MOISTURIZING EX LOTN
TOPICAL_LOTION | CUTANEOUS | Status: DC | PRN
  Filled 2018-09-09: qty 473

## 2018-09-09 MED ORDER — POTASSIUM CHLORIDE 10 MEQ/100ML IV SOLN
10.0000 meq | INTRAVENOUS | Status: AC
  Administered 2018-09-09 (×4): 10 meq via INTRAVENOUS
  Filled 2018-09-09 (×2): qty 100

## 2018-09-09 NOTE — Progress Notes (Addendum)
  Date: 09/09/2018  Patient name: Austin Payne  Medical record number: [id number]  Date of birth: [birth year]   I have seen and evaluated this patient and I have discussed the plan of care with the house staff. Please see their note for complete details. I concur with their findings with the following additions/corrections:  62 year old man admitted for severe hypercalcemia, concerning for metastatic cancer.  CT chest yesterday showed widespread metastatic disease to the lungs and bones, no clearly identified primary lesion.  Discussed with plastic surgery today, they will evaluate him for possible biopsy of his chronic neck wound.  Calcium has improved significantly with hydration, also received zoledronic acid.  Lenice Pressman, M.D., Ph.D. 09/09/2018, 5:28 PM
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
## 2018-09-10 ENCOUNTER — Other Ambulatory Visit: Payer: Self-pay | Admitting: Physician Assistant

## 2018-09-10 DIAGNOSIS — R0902 Hypoxemia: Secondary | ICD-10-CM

## 2018-09-10 DIAGNOSIS — R0989 Other specified symptoms and signs involving the circulatory and respiratory systems: Secondary | ICD-10-CM

## 2018-09-10 DIAGNOSIS — R221 Localized swelling, mass and lump, neck: Secondary | ICD-10-CM

## 2018-09-10 LAB — MAGNESIUM
Magnesium: 1.7 mg/dL (ref 1.7–2.4)
Magnesium: 2.2 mg/dL (ref 1.7–2.4)

## 2018-09-10 LAB — RENAL FUNCTION PANEL
Albumin: 2.2 g/dL — ABNORMAL LOW (ref 3.5–5.0)
Albumin: 2.2 g/dL — ABNORMAL LOW (ref 3.5–5.0)
Anion gap: 7 (ref 5–15)
Anion gap: 8 (ref 5–15)
BUN: 14 mg/dL (ref 8–23)
BUN: 13 mg/dL (ref 8–23)
CO2: 21 mmol/L — ABNORMAL LOW (ref 22–32)
CO2: 21 mmol/L — ABNORMAL LOW (ref 22–32)
Calcium: 8.4 mg/dL — ABNORMAL LOW (ref 8.9–10.3)
Calcium: 7.7 mg/dL — ABNORMAL LOW (ref 8.9–10.3)
Chloride: 110 mmol/L (ref 98–111)
Chloride: 107 mmol/L (ref 98–111)
Creatinine, Ser: 0.97 mg/dL (ref 0.61–1.24)
Creatinine, Ser: 0.8 mg/dL (ref 0.61–1.24)
GFR calc Af Amer: >60 mL/min (ref >60)
GFR calc Af Amer: >60 mL/min (ref >60)
GFR calc non Af Amer: >60 mL/min (ref >60)
GFR calc non Af Amer: >60 mL/min (ref >60)
Glucose, Bld: 108 mg/dL — ABNORMAL HIGH (ref 70–99)
Glucose, Bld: 113 mg/dL — ABNORMAL HIGH (ref 70–99)
Phosphorus: <1 mg/dL — CL (ref 2.5–4.6)
Phosphorus: 2.6 mg/dL (ref 2.5–4.6)
Potassium: 3.6 mmol/L (ref 3.5–5.1)
Potassium: 3 mmol/L — ABNORMAL LOW (ref 3.5–5.1)
Sodium: 138 mmol/L (ref 135–145)
Sodium: 136 mmol/L (ref 135–145)

## 2018-09-10 MED ORDER — SODIUM PHOSPHATES 45 MMOLE/15ML IV SOLN
30.0000 mmol | Freq: Once | INTRAVENOUS | Status: AC
  Administered 2018-09-10: 30 mmol via INTRAVENOUS
  Filled 2018-09-10: qty 10

## 2018-09-10 MED ORDER — IPRATROPIUM-ALBUTEROL 0.5-2.5 (3) MG/3ML IN SOLN: 3.0000 mL | Freq: Four times a day (QID) | RESPIRATORY_TRACT | Status: DC

## 2018-09-10 MED ORDER — LIDOCAINE-EPINEPHRINE (PF) 1 %-1:200000 IJ SOLN
20.0000 mL | Freq: Once | INTRAMUSCULAR | Status: DC
  Filled 2018-09-10 (×2): qty 20

## 2018-09-10 MED ORDER — IPRATROPIUM-ALBUTEROL 0.5-2.5 (3) MG/3ML IN SOLN
3.0000 mL | Freq: Four times a day (QID) | RESPIRATORY_TRACT | Status: DC | PRN
  Administered 2018-09-11: 3 mL via RESPIRATORY_TRACT
  Filled 2018-09-10: qty 3

## 2018-09-10 MED ORDER — MAGNESIUM SULFATE 50 % IJ SOLN
3.0000 g | Freq: Once | INTRAVENOUS | Status: AC
  Administered 2018-09-10: 3 g via INTRAVENOUS
  Filled 2018-09-10: qty 6

## 2018-09-10 MED ORDER — LIDOCAINE-EPINEPHRINE (PF) 1 %-1:200000 IJ SOLN: 20.0000 mL | Freq: Once | INTRAMUSCULAR | Status: AC
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
## 2018-09-10 NOTE — Progress Notes (Signed)
CRITICAL VALUE ALERT  Critical Value:  Phosphorus <1.0  Date & Time Notied:  09/10/18 4:49  Provider Notified:  Silas Sacramento, NP  Orders Received/Actions taken: Awaiting callback or orders to be placed

## 2018-09-10 NOTE — Progress Notes (Signed)
  Date: 09/10/2018  Patient name: Austin Payne  Medical record number: [id number]  Date of birth: [birth year]   I have seen and evaluated this patient and I have discussed the plan of care with the house staff. Please see their note for complete details.  Resident note to follow.  Hypercalcemia has resolved, corrected calcium 9.8 today.  He has developed significant hypophosphatemia, will need to replete today.  We will continue IV fluids as well.  Working on [employer] as he presented with severe malnutrition.  Next step in malignancy work-up is biopsy of his chronic ulcerated neck wound.  We have consulted plastic surgery for assistance with this.  Lenice Pressman, M.D., Ph.D. 09/10/2018, 5:03 PM

## 2018-09-11 DIAGNOSIS — M8448XA Pathological fracture, other site, initial encounter for fracture: Secondary | ICD-10-CM

## 2018-09-11 DIAGNOSIS — R633 Feeding difficulties: Secondary | ICD-10-CM

## 2018-09-11 DIAGNOSIS — E876 Hypokalemia: Secondary | ICD-10-CM

## 2018-09-11 LAB — RENAL FUNCTION PANEL
Albumin: 2.1 g/dL — ABNORMAL LOW (ref 3.5–5.0)
Anion gap: 7 (ref 5–15)
BUN: 12 mg/dL (ref 8–23)
CO2: 22 mmol/L (ref 22–32)
Calcium: 7.6 mg/dL — ABNORMAL LOW (ref 8.9–10.3)
Chloride: 107 mmol/L (ref 98–111)
Creatinine, Ser: 0.79 mg/dL (ref 0.61–1.24)
GFR calc Af Amer: >60 mL/min (ref >60)
GFR calc non Af Amer: >60 mL/min (ref >60)
Glucose, Bld: 112 mg/dL — ABNORMAL HIGH (ref 70–99)
Phosphorus: 1.8 mg/dL — ABNORMAL LOW (ref 2.5–4.6)
Potassium: 3.2 mmol/L — ABNORMAL LOW (ref 3.5–5.1)
Sodium: 136 mmol/L (ref 135–145)

## 2018-09-11 LAB — CULTURE, BLOOD (ROUTINE X 2)
CULTURE: NO GROWTH
Culture: NO GROWTH
Special Requests: ADEQUATE

## 2018-09-11 LAB — SURGICAL PCR SCREEN
MRSA, PCR: NEGATIVE
Staphylococcus aureus: POSITIVE — AB

## 2018-09-11 LAB — PTH-RELATED PEPTIDE

## 2018-09-11 LAB — MAGNESIUM: Magnesium: 2 mg/dL (ref 1.7–2.4)

## 2018-09-11 MED ORDER — POTASSIUM CHLORIDE CRYS ER 20 MEQ PO TBCR
40.0000 meq | EXTENDED_RELEASE_TABLET | Freq: Two times a day (BID) | ORAL | Status: DC
  Administered 2018-09-11 – 2018-09-17 (×14): 40 meq via ORAL
  Filled 2018-09-11 (×3): qty 2
  Filled 2018-09-11: qty 4
  Filled 2018-09-11 (×11): qty 2

## 2018-09-11 MED ORDER — MUPIROCIN 2 % EX OINT
1.0000 "application " | TOPICAL_OINTMENT | Freq: Two times a day (BID) | CUTANEOUS | Status: AC
  Administered 2018-09-11 – 2018-09-13 (×5): 1 via NASAL
  Filled 2018-09-11 (×3): qty 22

## 2018-09-11 MED ORDER — POTASSIUM PHOSPHATES 15 MMOLE/5ML IV SOLN
30.0000 mmol | Freq: Once | INTRAVENOUS | Status: AC
  Administered 2018-09-11: 30 mmol via INTRAVENOUS
  Filled 2018-09-11: qty 10

## 2018-09-11 MED ORDER — POTASSIUM CHLORIDE CRYS ER 20 MEQ PO TBCR: 40.0000 meq | EXTENDED_RELEASE_TABLET | Freq: Two times a day (BID) | ORAL | Status: DC

## 2018-09-11 MED ORDER — POTASSIUM & SODIUM PHOSPHATES 280-160-250 MG PO PACK
1.0000 | PACK | Freq: Three times a day (TID) | ORAL | Status: AC
  Administered 2018-09-11 (×2): 1 via ORAL
  Filled 2018-09-11 (×3): qty 1
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
## 2018-09-11 NOTE — Progress Notes (Signed)
Patient ID: Austin Payne, male   DOB: 09-09-1956, 62 y.o.   MRN: [id number] Films reviewed CT scan consistent with widely metastatic disease to the cervical spine with lytic lesions and what appears to be a stable pathologic fracture of the posterior lamina and spinous process of C2. Multiple other lytic lesions throughout his cervical spine vertebral bodies and elsewhere. This does not appear to be unstable continue with cervical collar recommend MRI scan cervical spine for evaluation and continued workup identified stage primary carcinoma.
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
## 2018-09-11 NOTE — Progress Notes (Addendum)
  Date: 09/11/2018  Patient name: Austin Payne  Medical record number: [id number]  Date of birth: 1956/06/23   I have seen and evaluated this patient and I have discussed the plan of care with the house staff. Please see their note for complete details.  Resident note to follow.  Appreciate evaluation by plastic surgery, planning for excision of his chronic lesion on his left neck tomorrow.  This should hopefully provide diagnostic information as well as improve management and care of the site.  Discussed his pathologic C2 fracture with neurosurgery today, they recommend that he remain in a collar for stability and obtain an MRI of the cervical spine for better evaluation, but it appears that the fracture is stable for now.  He continues to have evidence of refeeding syndrome with hypokalemia and hypophosphatemia requiring daily repletion.  He presented with severe malnutrition, likely due to his social situation, will continue to provide maximal nutrition here.  Hypercalcemia at presentation has resolved.  Continues on oxygen by nasal cannula, unclear etiology for his hypoxemia, but no evidence of acute pathology on lung exam.  He has had occasional wheezing, I suspect he has some degree of COPD from his chronic smoking, which may explain his hypoxemia.  We will continue breathing treatments as needed and evaluate his need for oxygen prior to discharge.  Lenice Pressman, M.D., Ph.D. 09/11/2018, 6:17 PM

## 2018-09-11 NOTE — Anesthesia Preprocedure Evaluation (Addendum)
Anesthesia Evaluation    Airway Mallampati: III  TM Distance: >3 FB Neck ROM: Full    Dental   Very poor dentition with few remaining teeth. Those remaining are broken. Per patient they are not loose.:   Pulmonary    + rhonchi  (-) decreased breath sounds      Cardiovascular hypertension,  Rhythm:Regular Rate:Normal     Neuro/Psych    GI/Hepatic   Endo/Other    Renal/GU      Musculoskeletal   Abdominal   Peds  Hematology   Anesthesia Other Findings   Reproductive/Obstetrics                          Anesthesia Physical Anesthesia Plan  ASA: IV  Anesthesia Plan: General   Post-op Pain Management:    Induction: Intravenous  PONV Risk Score and Plan: 2 and Ondansetron, Dexamethasone and Treatment may vary due to age or medical condition  Airway Management Planned: Oral ETT, LMA and Video Laryngoscope Planned  Additional Equipment: None  Intra-op Plan:   Post-operative Plan: Extubation in OR and Possible Post-op intubation/ventilation  Informed Consent: I have reviewed the patients History and Physical, chart, labs and discussed the procedure including the risks, benefits and alternatives for the proposed anesthesia with the patient or authorized representative who has indicated his/her understanding and acceptance.     Plan Discussed with:   Anesthesia Plan Comments: (LMA vs. ETT depending on surgical need. Plan for Glidescope if ETT d/t pathologic C2 fx)      Anesthesia Quick Evaluation

## 2018-09-12 ENCOUNTER — Inpatient Hospital Stay (HOSPITAL_COMMUNITY): Payer: Medicaid Other | Admitting: Anesthesiology

## 2018-09-12 ENCOUNTER — Encounter (HOSPITAL_COMMUNITY)
Admission: EM | Disposition: A | Discharge status: Skilled Nursing Facility | Payer: Self-pay | Source: Home / Self Care | Attending: Student in an Organized Health Care Education/Training Program

## 2018-09-12 HISTORY — PX: LESION REMOVAL: SHX5196

## 2018-09-12 LAB — RENAL FUNCTION PANEL
Albumin: 2.3 g/dL — ABNORMAL LOW (ref 3.5–5.0)
Anion gap: 9 (ref 5–15)
BUN: 13 mg/dL (ref 8–23)
CO2: 20 mmol/L — ABNORMAL LOW (ref 22–32)
Calcium: 7.7 mg/dL — ABNORMAL LOW (ref 8.9–10.3)
Chloride: 103 mmol/L (ref 98–111)
Creatinine, Ser: 0.93 mg/dL (ref 0.61–1.24)
GFR calc Af Amer: >60 mL/min (ref >60)
GFR calc non Af Amer: >60 mL/min (ref >60)
Glucose, Bld: 141 mg/dL — ABNORMAL HIGH (ref 70–99)
Phosphorus: 2.4 mg/dL — ABNORMAL LOW (ref 2.5–4.6)
Potassium: 5.1 mmol/L (ref 3.5–5.1)
Sodium: 132 mmol/L — ABNORMAL LOW (ref 135–145)

## 2018-09-12 LAB — MAGNESIUM: Magnesium: 2 mg/dL (ref 1.7–2.4)

## 2018-09-12 SURGERY — WIDE EXCISION, LESION, UPPER EXTREMITY
Anesthesia: General | Site: Neck | Laterality: Left

## 2018-09-12 MED ORDER — PROPOFOL 10 MG/ML IV BOLUS
INTRAVENOUS | Status: DC | PRN
  Administered 2018-09-12: 110 mg via INTRAVENOUS

## 2018-09-12 MED ORDER — PROPOFOL 10 MG/ML IV BOLUS
INTRAVENOUS | Status: AC
  Filled 2018-09-12: qty 20

## 2018-09-12 MED ORDER — FENTANYL CITRATE (PF) 100 MCG/2ML IJ SOLN: 25.0000 ug | INTRAMUSCULAR | Status: DC | PRN

## 2018-09-12 MED ORDER — FENTANYL CITRATE (PF) 250 MCG/5ML IJ SOLN
INTRAMUSCULAR | Status: DC | PRN
  Administered 2018-09-12 (×5): 50 ug via INTRAVENOUS

## 2018-09-12 MED ORDER — SODIUM CHLORIDE 0.9 % IV SOLN
INTRAVENOUS | Status: DC | PRN
  Administered 2018-09-12: 25 ug/min via INTRAVENOUS

## 2018-09-12 MED ORDER — LIDOCAINE 2% (20 MG/ML) 5 ML SYRINGE
INTRAMUSCULAR | Status: DC | PRN
  Administered 2018-09-12: 60 mg via INTRAVENOUS

## 2018-09-12 MED ORDER — CEFAZOLIN SODIUM-DEXTROSE 2-4 GM/100ML-% IV SOLN
INTRAVENOUS | Status: AC
  Filled 2018-09-12: qty 100

## 2018-09-12 MED ORDER — LACTATED RINGERS IV SOLN
INTRAVENOUS | Status: DC | PRN
  Administered 2018-09-12: 07:00:00 via INTRAVENOUS

## 2018-09-12 MED ORDER — HYDROMORPHONE HCL 1 MG/ML IJ SOLN: 0.5000 mg | INTRAMUSCULAR | Status: DC | PRN

## 2018-09-12 MED ORDER — LIDOCAINE HCL (PF) 1 % IJ SOLN
INTRAMUSCULAR | Status: AC
  Filled 2018-09-12: qty 30

## 2018-09-12 MED ORDER — ONDANSETRON HCL 4 MG/2ML IJ SOLN
INTRAMUSCULAR | Status: DC | PRN
  Administered 2018-09-12: 4 mg via INTRAVENOUS

## 2018-09-12 MED ORDER — SODIUM PHOSPHATES 45 MMOLE/15ML IV SOLN
10.0000 mmol | Freq: Once | INTRAVENOUS | Status: AC
  Administered 2018-09-12: 10 mmol via INTRAVENOUS
  Filled 2018-09-12: qty 3.33

## 2018-09-12 MED ORDER — FENTANYL CITRATE (PF) 250 MCG/5ML IJ SOLN
INTRAMUSCULAR | Status: AC
  Filled 2018-09-12: qty 5

## 2018-09-12 MED ORDER — MIDAZOLAM HCL 2 MG/2ML IJ SOLN
INTRAMUSCULAR | Status: AC
  Filled 2018-09-12: qty 2

## 2018-09-12 MED ORDER — CEFAZOLIN SODIUM-DEXTROSE 2-3 GM-%(50ML) IV SOLR
INTRAVENOUS | Status: DC | PRN
  Administered 2018-09-12: 2 g via INTRAVENOUS

## 2018-09-12 MED ORDER — DEXAMETHASONE SODIUM PHOSPHATE 10 MG/ML IJ SOLN
INTRAMUSCULAR | Status: DC | PRN
  Administered 2018-09-12: 5 mg via INTRAVENOUS

## 2018-09-12 MED ORDER — LIDOCAINE-EPINEPHRINE 1 %-1:100000 IJ SOLN
INTRAMUSCULAR | Status: AC
  Filled 2018-09-12: qty 1

## 2018-09-12 MED ORDER — OXYCODONE-ACETAMINOPHEN 5-325 MG PO TABS
1.0000 | ORAL_TABLET | Freq: Four times a day (QID) | ORAL | Status: AC | PRN
  Administered 2018-09-14: 1 via ORAL
  Filled 2018-09-12: qty 1

## 2018-09-12 MED ORDER — OXYCODONE HCL 5 MG/5ML PO SOLN: 5.0000 mg | Freq: Once | ORAL | Status: DC | PRN

## 2018-09-12 MED ORDER — MIDAZOLAM HCL 5 MG/5ML IJ SOLN
INTRAMUSCULAR | Status: DC | PRN
  Administered 2018-09-12: 1 mg via INTRAVENOUS

## 2018-09-12 MED ORDER — OXYCODONE HCL 5 MG PO TABS: 5.0000 mg | ORAL_TABLET | Freq: Once | ORAL | Status: DC | PRN

## 2018-09-12 MED ORDER — 0.9 % SODIUM CHLORIDE (POUR BTL) OPTIME
TOPICAL | Status: DC | PRN
  Administered 2018-09-12: 1000 mL

## 2018-09-12 MED ORDER — LIDOCAINE-EPINEPHRINE 1 %-1:100000 IJ SOLN
INTRAMUSCULAR | Status: DC | PRN
  Administered 2018-09-12: 20 mL

## 2018-09-12 MED ORDER — ONDANSETRON HCL 4 MG/2ML IJ SOLN: 4.0000 mg | Freq: Once | INTRAMUSCULAR | Status: DC | PRN

## 2018-09-12 SURGICAL SUPPLY — 65 items
BAG URINE DRAINAGE (UROLOGICAL SUPPLIES) ×2 IMPLANT
BENZOIN TINCTURE PRP APPL 2/3 (GAUZE/BANDAGES/DRESSINGS) IMPLANT
BLADE 10 SAFETY STRL DISP (BLADE) ×4 IMPLANT
BLADE CLIPPER SURG (BLADE) IMPLANT
BLADE SURG 11 STRL SS (BLADE) IMPLANT
BLADE SURG 15 STRL LF DISP TIS (BLADE) ×1 IMPLANT
BLADE SURG 15 STRL SS (BLADE)
CANISTER SUCT 1200ML W/VALVE (MISCELLANEOUS) IMPLANT
CHLORAPREP W/TINT 26ML (MISCELLANEOUS) ×3 IMPLANT
CLOSURE WOUND 1/2 X4 (GAUZE/BANDAGES/DRESSINGS)
CONT SPEC 4OZ CLIKSEAL STRL BL (MISCELLANEOUS) ×2 IMPLANT
COVER BACK TABLE 60X90IN (DRAPES) ×3 IMPLANT
COVER MAYO STAND STRL (DRAPES) ×3 IMPLANT
COVER WAND RF STERILE (DRAPES) ×3 IMPLANT
DERMABOND ADVANCED (GAUZE/BANDAGES/DRESSINGS)
DERMABOND ADVANCED .7 DNX12 (GAUZE/BANDAGES/DRESSINGS) IMPLANT
DRAIN SNY 10 ROU (WOUND CARE) IMPLANT
DRAPE PED LAPAROTOMY (DRAPES) IMPLANT
DRAPE U-SHAPE 76X120 STRL (DRAPES) ×3 IMPLANT
DRSG ADAPTIC 3X8 NADH LF (GAUZE/BANDAGES/DRESSINGS) ×4 IMPLANT
DRSG VAC ATS MED SENSATRAC (GAUZE/BANDAGES/DRESSINGS) ×2 IMPLANT
ELECT COATED BLADE 2.86 ST (ELECTRODE) ×2 IMPLANT
ELECT NDL BLADE 2-5/6 (NEEDLE) ×1 IMPLANT
ELECT NEEDLE BLADE 2-5/6 (NEEDLE) ×3 IMPLANT
ELECT REM PT RETURN 9FT ADLT (ELECTROSURGICAL) ×3
ELECT REM PT RETURN 9FT PED (ELECTROSURGICAL)
ELECTRODE REM PT RETRN 9FT PED (ELECTROSURGICAL) IMPLANT
ELECTRODE REM PT RTRN 9FT ADLT (ELECTROSURGICAL) IMPLANT
EVACUATOR SILICONE 100CC (DRAIN) IMPLANT
GAUZE SPONGE 2X2 8PLY STRL LF (GAUZE/BANDAGES/DRESSINGS) IMPLANT
GAUZE SPONGE 4X4 12PLY STRL LF (GAUZE/BANDAGES/DRESSINGS) IMPLANT
GAUZE XEROFORM 1X8 LF (GAUZE/BANDAGES/DRESSINGS) IMPLANT
GLOVE BIO SURGEON STRL SZ 6 (GLOVE) ×3 IMPLANT
GOWN STRL REUS W/ TWL LRG LVL3 (GOWN DISPOSABLE) ×2 IMPLANT
GOWN STRL REUS W/TWL LRG LVL3 (GOWN DISPOSABLE) ×4
KIT BASIN OR (CUSTOM PROCEDURE TRAY) ×3 IMPLANT
MATRIX TISSUE MESHED 4X5 (Tissue) ×2 IMPLANT
NDL HYPO 30X.5 LL (NEEDLE) IMPLANT
NDL PRECISIONGLIDE 27X1.5 (NEEDLE) ×1 IMPLANT
NEEDLE HYPO 30X.5 LL (NEEDLE) IMPLANT
NEEDLE PRECISIONGLIDE 27X1.5 (NEEDLE) ×3 IMPLANT
NS IRRIG 1000ML POUR BTL (IV SOLUTION) ×2 IMPLANT
PENCIL BUTTON HOLSTER BLD 10FT (ELECTRODE) ×2 IMPLANT
RUBBERBAND STERILE (MISCELLANEOUS) IMPLANT
SHEET MEDIUM DRAPE 40X70 STRL (DRAPES) IMPLANT
SPONGE GAUZE 2X2 STER 10/PKG (GAUZE/BANDAGES/DRESSINGS)
SPONGE LAP 18X18 X RAY DECT (DISPOSABLE) ×4 IMPLANT
STAPLER VISISTAT 35W (STAPLE) ×2 IMPLANT
STRIP CLOSURE SKIN 1/2X4 (GAUZE/BANDAGES/DRESSINGS) IMPLANT
SUCTION FRAZIER HANDLE 10FR (MISCELLANEOUS) ×2
SUCTION TUBE FRAZIER 10FR DISP (MISCELLANEOUS) IMPLANT
SUT CHROMIC 4 0 PS 2 18 (SUTURE) ×4 IMPLANT
SUT ETHILON 4 0 PS 2 18 (SUTURE) IMPLANT
SUT MNCRL AB 4-0 PS2 18 (SUTURE) IMPLANT
SUT MON AB 5-0 P3 18 (SUTURE) IMPLANT
SUT PLAIN 5 0 P 3 18 (SUTURE) IMPLANT
SUT PROLENE 5 0 P 3 (SUTURE) IMPLANT
SUT PROLENE 6 0 P 1 18 (SUTURE) IMPLANT
SUT VICRYL 4-0 PS2 18IN ABS (SUTURE) IMPLANT
SYR BULB 3OZ (MISCELLANEOUS) IMPLANT
SYR CONTROL 10ML LL (SYRINGE) ×2 IMPLANT
TOWEL OR 17X24 6PK STRL BLUE (TOWEL DISPOSABLE) ×3 IMPLANT
TRAY ENT MC OR (CUSTOM PROCEDURE TRAY) ×3 IMPLANT
TUBE CONNECTING 20'X1/4 (TUBING)
TUBE CONNECTING 20X1/4 (TUBING) IMPLANT

## 2018-09-12 NOTE — Anesthesia Procedure Notes (Signed)
Procedure Name: LMA Insertion Date/Time: 09/12/2018 7:31 AM Performed by: Mariea Clonts, CRNA Pre-anesthesia Checklist: Patient identified, Emergency Drugs available, Suction available and Patient being monitored Patient Re-evaluated:Patient Re-evaluated prior to induction Oxygen Delivery Method: Circle System Utilized Preoxygenation: Pre-oxygenation with 100% oxygen Induction Type: IV induction Ventilation: Mask ventilation without difficulty LMA: LMA inserted LMA Size: 5.0 Number of attempts: 1 Airway Equipment and Method: Bite block Placement Confirmation: positive ETCO2 Tube secured with: Tape Dental Injury: Teeth and Oropharynx as per pre-operative assessment

## 2018-09-12 NOTE — Interval H&P Note (Signed)
History and Physical Interval Note:  09/12/2018 6:52 AM  Austin Payne  has presented today for surgery, with the diagnosis of left neck ulcer chronic  The various methods of treatment have been discussed with the patient and family. After consideration of risks, benefits and other options for treatment, the patient has consented to  Procedure(s): EXCISION MALIGNANT LESION LEFT NECK 10 CM APPLICATION INTEGRA (Left) as a surgical intervention .  The patient's history has been reviewed, patient examined, no change in status, stable for surgery.  I have reviewed the patient's chart and labs.  Questions were answered to the patient's satisfaction.     Arnoldo Hooker Jhonnie Aliano

## 2018-09-12 NOTE — Progress Notes (Signed)
  Plastic Surgery  See Op notes for details. Grossly invading SCM as noted in preop imaging. Plan sponge bolster dressing for 5-7 d over Integra, this can be removed in OP setting pending his disposition plan. Keep dry intact with sponge bathing face and neck during that time,ok to shower body.  Patient not in cervical precautions/C collar. Patient maintained in neutral position during surgery and transfer. Benefit from clarification if C collar required.  [name], MD [facility] Plastic & Reconstructive Surgery [phone], pin [phone]

## 2018-09-12 NOTE — Progress Notes (Signed)
  Date: 09/12/2018  Patient name: Austin Payne  Medical record number: [id number]  Date of birth: 1956-09-03   I have seen and evaluated this patient and I have discussed the plan of care with the house staff. Please see their note for complete details. I concur with their findings with the following additions/corrections:   Was able to go for excision of the left neck ulcerated lesion this morning.  Per plastic surgery notes, appearance was consistent with imaging showing invasion of the sternocleidomastoid muscle and parotid fascia, highly concerning for malignancy as expected.  On my examination this afternoon after he returned from the OR, he was slightly somnolent, but otherwise feeling well.  His surgical site was covered with a sponge dressing and appeared clean, dry, and intact.  We will await pathologic confirmation of the etiology of the lesion.  Continues to have lateral head abnormalities from his severe malnutrition as well as likely malignant hypercalcemia.  However, these electively derangements have improved with repletion and IV fluids.  We will continue to trend daily, watch for refeeding syndrome, and replete as needed.  Lenice Pressman, M.D., Ph.D. 09/12/2018, 4:52 PM
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
## 2018-09-12 NOTE — Op Note (Signed)
Operative Note   DATE OF OPERATION: 11.14.19  LOCATION: [facility]- inpatient  SURGICAL DIVISION: Plastic Surgery  PREOPERATIVE DIAGNOSES:  1. Left neck lesion 2. Metastatic carcinoma  POSTOPERATIVE DIAGNOSES:  same  PROCEDURE: 1. Excision malignant lesion left neck 10 cm 2. Application Integra dermal substrate x 11 cm  SURGEON: [name] MD MBA  ASSISTANT: none  ANESTHESIA:  General.   EBL: 20 ml  COMPLICATIONS: None immediate.   INDICATIONS FOR PROCEDURE:  The patient, Austin Payne, is a 62 y.o. male born on 23-Nov-1955, is here for excision ulcerated lesion left neck present for at least 2 years suspicious for squamous cell carcinoma.   FINDINGS: 8 x 9 cm ulcerated necrotic lesion left neck with gross invasion of SCM and parotid fascia.  DESCRIPTION OF PROCEDURE:  The patient's operative site was marked with the patient in the preoperative area. The patient was taken to the operating room. IV antibiotics were given. The patient's operative site was prepped and draped in a sterile fashion. A time out was performed and all information was confirmed to be correct. Local anesthetic infiltrated surrounding wound. Excision of lesion with margins, diameter 10 cm completed. As noted in findings deep margin grossly invading SCM. Any gross tumor or nodularity excised with portion SCM. Hemostasis ensured and additional local placed in base wound. Integra dermal substrate inset to wound with 4-0 chromic, total 110 cm2. Bolster dressing Adaptic and sterile sponge applied with staples.  The patient was allowed to wake from anesthesia, extubated and taken to the recovery room in satisfactory condition.   SPECIMENS: left neck lesion  DRAINS: none  [name], MD [facility] Reconstruct[facility] Surgery [phone], pin [phone]

## 2018-09-12 NOTE — Progress Notes (Signed)
Chaplain called to unit PERIOP to locate pt because Bed not identified.  Chaplain learned patient was in PACU and it was not good time for chaplain to explain AD.  If pt wishes to do it at a later time, chaplain will be available.  Austin Payne [phone] [facility]

## 2018-09-12 NOTE — Anesthesia Postprocedure Evaluation (Signed)
Anesthesia Post Note  Patient: Austin Payne  Procedure(s) Performed: EXCISION MALIGNANT LESION LEFT NECK 10 CM APPLICATION INTEGRA (Left Neck)     Patient location during evaluation: PACU Anesthesia Type: General Level of consciousness: awake and alert Pain management: pain level controlled Vital Signs Assessment: post-procedure vital signs reviewed and stable Respiratory status: spontaneous breathing, nonlabored ventilation and respiratory function stable Cardiovascular status: blood pressure returned to baseline and stable Postop Assessment: no apparent nausea or vomiting Anesthetic complications: no    Last Vitals:  Vitals:   09/12/18 0948 09/12/18 1421  BP: 114/72 127/83  Pulse: 90 78  Resp: 12 14  Temp: 36.4 C 37.2 C  SpO2: 96% 98%    Last Pain:  Vitals:   09/12/18 1421  TempSrc: Oral  PainSc:                  Lidia Collum

## 2018-09-12 NOTE — Transfer of Care (Signed)
Immediate Anesthesia Transfer of Care Note  Patient: Austin Payne  Procedure(s) Performed: EXCISION MALIGNANT LESION LEFT NECK 10 CM APPLICATION INTEGRA (Left Neck)  Patient Location: PACU  Anesthesia Type:General  Level of Consciousness: awake, alert  and oriented  Airway & Oxygen Therapy: Patient Spontanous Breathing and Patient connected to nasal cannula oxygen  Post-op Assessment: Report given to RN, Post -op Vital signs reviewed and stable and Patient moving all extremities X 4  Post vital signs: Reviewed and stable  Last Vitals:  Vitals Value Taken Time  BP 105/64 09/12/2018  8:39 AM  Temp    Pulse 93 09/12/2018  8:46 AM  Resp 17 09/12/2018  8:46 AM  SpO2 97 % 09/12/2018  8:46 AM  Vitals shown include unvalidated device data.  Last Pain:  Vitals:   09/12/18 0425  TempSrc: Oral  PainSc:       Patients Stated Pain Goal: 0 (20/25/42 7062)  Complications: No apparent anesthesia complications

## 2018-09-13 ENCOUNTER — Encounter (HOSPITAL_COMMUNITY): Payer: Self-pay

## 2018-09-13 ENCOUNTER — Inpatient Hospital Stay (HOSPITAL_COMMUNITY): Payer: Medicaid Other

## 2018-09-13 DIAGNOSIS — R252 Cramp and spasm: Secondary | ICD-10-CM

## 2018-09-13 LAB — CBC
HCT: 21.7 % — ABNORMAL LOW (ref 39.0–52.0)
Hemoglobin: 7 g/dL — ABNORMAL LOW (ref 13.0–17.0)
MCH: 27.2 pg (ref 26.0–34.0)
MCHC: 32.3 g/dL (ref 30.0–36.0)
MCV: 84.4 fL (ref 80.0–100.0)
Platelets: 138 10*3/uL — ABNORMAL LOW (ref 150–400)
RBC: 2.57 MIL/uL — ABNORMAL LOW (ref 4.22–5.81)
RDW: 16 % — ABNORMAL HIGH (ref 11.5–15.5)
WBC: 12.1 10*3/uL — ABNORMAL HIGH (ref 4.0–10.5)
nRBC: 1.1 % — ABNORMAL HIGH (ref 0.0–0.2)

## 2018-09-13 LAB — RENAL FUNCTION PANEL
Albumin: 2.1 g/dL — ABNORMAL LOW (ref 3.5–5.0)
Anion gap: 9 (ref 5–15)
BUN: 14 mg/dL (ref 8–23)
CO2: 22 mmol/L (ref 22–32)
Calcium: 7.5 mg/dL — ABNORMAL LOW (ref 8.9–10.3)
Chloride: 103 mmol/L (ref 98–111)
Creatinine, Ser: 0.82 mg/dL (ref 0.61–1.24)
GFR calc Af Amer: >60 mL/min (ref >60)
GFR calc non Af Amer: >60 mL/min (ref >60)
Glucose, Bld: 102 mg/dL — ABNORMAL HIGH (ref 70–99)
Phosphorus: 2.5 mg/dL (ref 2.5–4.6)
Potassium: 4.5 mmol/L (ref 3.5–5.1)
Sodium: 134 mmol/L — ABNORMAL LOW (ref 135–145)

## 2018-09-13 LAB — MAGNESIUM: Magnesium: 2.1 mg/dL (ref 1.7–2.4)

## 2018-09-13 MED ORDER — GADOBUTROL 1 MMOL/ML IV SOLN
5.0000 mL | Freq: Once | INTRAVENOUS | Status: AC | PRN
  Administered 2018-09-13: 5 mL via INTRAVENOUS

## 2018-09-13 NOTE — Progress Notes (Signed)
  Plastic Surgery POD#1 excision left neck lesion application Integra  MRI cervical spine completed today.  Temp:  [98 F (36.7 C)-98.2 F (36.8 C)] 98 F (36.7 C) (11/15 0406) Pulse Rate:  [74-79] 77 (11/15 0406) Resp:  [18] 18 (11/15 0406) BP: (117-136)/(75-80) 117/80 (11/15 0406) SpO2:  [96 %-97 %] 96 % (11/15 0406)   Patient alert denies pain  PE: Sponge bolster left neck intact no drainage  A/P Awaiting pathology. Plan bolster dressing for 5-7 d over area. Ok to shower body, keep neck dressing dry/sponge bathe face.   [name], MD [facility] Plastic & Reconstructive Surgery [phone], pin [phone]
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
## 2018-09-14 LAB — QUANTIFERON-TB GOLD PLUS (RQFGPL)
QuantiFERON Mitogen Value: 0.45 IU/mL
QuantiFERON Nil Value: 0.03 IU/mL
QuantiFERON TB1 Ag Value: 0.02 IU/mL
QuantiFERON TB2 Ag Value: 0.04 IU/mL

## 2018-09-14 LAB — CBC
HCT: 21.1 % — ABNORMAL LOW (ref 39.0–52.0)
Hemoglobin: 7 g/dL — ABNORMAL LOW (ref 13.0–17.0)
MCH: 28.2 pg (ref 26.0–34.0)
MCHC: 33.2 g/dL (ref 30.0–36.0)
MCV: 85.1 fL (ref 80.0–100.0)
Platelets: 128 10*3/uL — ABNORMAL LOW (ref 150–400)
RBC: 2.48 MIL/uL — ABNORMAL LOW (ref 4.22–5.81)
RDW: 16.2 % — ABNORMAL HIGH (ref 11.5–15.5)
WBC: 10 10*3/uL (ref 4.0–10.5)
nRBC: 1.9 % — ABNORMAL HIGH (ref 0.0–0.2)

## 2018-09-14 LAB — RENAL FUNCTION PANEL
Albumin: 2.1 g/dL — ABNORMAL LOW (ref 3.5–5.0)
Anion gap: 10 (ref 5–15)
BUN: 17 mg/dL (ref 8–23)
CO2: 21 mmol/L — ABNORMAL LOW (ref 22–32)
Calcium: 7.5 mg/dL — ABNORMAL LOW (ref 8.9–10.3)
Chloride: 102 mmol/L (ref 98–111)
Creatinine, Ser: 0.71 mg/dL (ref 0.61–1.24)
GFR calc Af Amer: >60 mL/min (ref >60)
GFR calc non Af Amer: >60 mL/min (ref >60)
Glucose, Bld: 85 mg/dL (ref 70–99)
Phosphorus: 1.5 mg/dL — ABNORMAL LOW (ref 2.5–4.6)
Potassium: 4.4 mmol/L (ref 3.5–5.1)
Sodium: 133 mmol/L — ABNORMAL LOW (ref 135–145)

## 2018-09-14 LAB — QUANTIFERON-TB GOLD PLUS: QuantiFERON-TB Gold Plus: UNDETERMINED

## 2018-09-14 LAB — MAGNESIUM: Magnesium: 1.9 mg/dL (ref 1.7–2.4)

## 2018-09-14 MED ORDER — SODIUM PHOSPHATES 45 MMOLE/15ML IV SOLN
30.0000 mmol | Freq: Once | INTRAVENOUS | Status: AC
  Administered 2018-09-14: 30 mmol via INTRAVENOUS
  Filled 2018-09-14: qty 10

## 2018-09-14 NOTE — Progress Notes (Signed)
  Plastic Surgery POD#2 excision left neck lesion application Integra  No events Tb test indeterminate and reordered  Pulse Rate:  [74-85] 74 (11/16 0906) BP: (114-118)/(70-74) 114/70 (11/16 0906) SpO2:  [96 %] 96 % (11/16 1443)   Patient eating lunch, states some neck pain overnight has a hot/cold pack in place   PE: Sponge bolster left neck intact no active drainage  A/P Stable anemia, low platelets without bleeding. No new recommendations- Awaiting pathology. Plan bolster dressing for 5-7 d over area. Ok to shower body, keep neck dressing dry/sponge bathe face.   [name], MD [facility] Plastic & Reconstructive Surgery [phone], pin [phone]

## 2018-09-14 NOTE — Consult Note (Signed)
Neurosurgery Consultation  Reason for Consult: Closed fracture of the cervical spine Referring Physician: [place]  CC: left neck pain  HPI: This is a 62 y.o. man that presents with a non-healing left neck wound that is concerning for what sounds like Marjolin's ulcer. He has no axial neck pain, just cutaneous and musculoskeletal left lateral neck pain with movement. No radicular pain, no new weakness, numbness, or parasthesias. He had a CT neck performed to evaluate this non-healing ulcer, which revealed diffuse spinal metastases and concern for a C2 fracture.  ROS: A 14 point ROS was performed and is negative except as noted in the HPI.   PMHx:  Past Medical History:  Diagnosis Date  . Hyperlipidemia   . Hypertension   . Myocardial infarction ([place])    04/23/17 PCI with DESx1 LCx EF 50%  . Neck mass   . NSTEMI (non-ST elevated myocardial infarction) ([place])   . SEMI (subendocardial myocardial infarction) ([place]) 04/21/2017  . Smoking 04/21/2017  . Tobacco use    FamHx: History reviewed. No pertinent family history. SocHx:  reports that he has never smoked. He has never used smokeless tobacco. He reports that he does not drink alcohol or use drugs.  Exam: Vital signs in last 24 hours: Pulse Rate:  [74-85] 74 (11/16 0906) BP: (114-118)/(70-74) 114/70 (11/16 0906) SpO2:  [96 %] 96 % (11/16 0614) General: Awake, alert, cooperative, lying in bed in NAD, appears chronically ill with significant temporalis wasting Head: normocephalic and atruamatic HEENT: left neck wound with bolster in place Pulmonary: breathing room air comfortably, no evidence of increased work of breathing Cardiac: RRR Abdomen: S NT ND Extremities: warm and well perfused x4 Neuro: Awake/alert and interactive, PERRL, gaze neutral, no diplopia on EOM testing, FS Strength 5/5 x4, SILTx4, no hoffman's   Assessment and Plan: 62 y.o. 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 CT C-spine personally reviewed, which shows diffuse  metastatic disease throughout the spine. In the spinolaminar junction of C2, there is a widened vascular channel versus possible small pathologic fracture.  -no acute neurosurgical intervention indicated at this time -stable fracture pattern, given his neck wound, no cervical collar -no need for scheduled neurosurgical follow up  Judith Part, MD 09/14/18 3:01 PM [facility]

## 2018-09-14 NOTE — Progress Notes (Signed)
   Subjective: Austin Payne reports no new complaints including SOB, chest pain, abdominal pain. He continues to complain of intermittent bilateral LE pain. He also has had difficulty swallowing his pills but he states that this is not new for him.  Objective:  Vital signs in last 24 hours: Vitals:   09/12/18 2336 09/13/18 0406 09/14/18 0614 09/14/18 0906  BP: 136/78 117/80 118/74 114/70  Pulse: 79 77 85 74  Resp: 18 18    Temp: 98 F (36.7 C) 98 F (36.7 C)    TempSrc: Oral Oral    SpO2: 96% 96% 96%   Weight:      Height:       Physical Exam  Constitutional: He is oriented to person, place, and time. He appears well-developed and well-nourished.  HENT:  Head: Normocephalic and atraumatic.  Wound dressing in place on left-side of neck.   Eyes: EOM are normal.  Neck: Normal range of motion.  Cardiovascular: Normal rate, regular rhythm and normal heart sounds.  Pulmonary/Chest: Effort normal and breath sounds normal.  Abdominal: Soft. Bowel sounds are normal. He exhibits no distension. There is no tenderness.  Musculoskeletal: He exhibits no edema or tenderness.  Neurological: He is alert and oriented to person, place, and time.  Skin: Skin is warm and dry.  Psychiatric: He has a normal mood and affect. His behavior is normal.  Nursing note and vitals reviewed.   Assessment/Plan:  Principal Problem:   Hypercalcemia Active Problems:   Smoking   Pulmonary nodules/lesions, multiple   Protein-calorie malnutrition, severe  Austin Payne presented with a chronic nonhealing wound of neck and found to have multiple lung nodules and lytic bone lesions on PET scan concerning for squamous cell carcinoma.  Now status post wound excision and pathology pending.  Nonhealing left neck wound 1.  Follow-up pathology results 2.  Continue Dilaudid 0.5 mg every 6 hours and Percocet 03/01/2024 every 6 hours as needed pain  Pathologic fracture at C2: MRI showed widespread bone metastasis  throughout cervical spine with multiple associated pathologic fractures. Quantiferon gold test performed to rule out POTTS disease but results were indeterminate.  1. Await neurosurgery recs. 2. Will get repeat quantiferon gold test.   Leg Cramping: Likely secondary to electrolyte abnormalities. This morning he was hypophosphatemic (1.5). 1. Ordered 30 mmol phos  Malnutrition: 1. Continue nutritional supplement 2. Will repleat phos per above.  3. Continue monitoring for refeeding syndrome.  Dispo: Anticipated discharge in approximately 2-3 days.   Austin Sage, MD 09/14/2018, 12:22 PM Pager: [phone]

## 2018-09-15 DIAGNOSIS — D62 Acute posthemorrhagic anemia: Secondary | ICD-10-CM

## 2018-09-15 LAB — RENAL FUNCTION PANEL
Albumin: 2.3 g/dL — ABNORMAL LOW (ref 3.5–5.0)
Anion gap: 10 (ref 5–15)
BUN: 16 mg/dL (ref 8–23)
CO2: 21 mmol/L — ABNORMAL LOW (ref 22–32)
Calcium: 7.9 mg/dL — ABNORMAL LOW (ref 8.9–10.3)
Chloride: 102 mmol/L (ref 98–111)
Creatinine, Ser: 0.87 mg/dL (ref 0.61–1.24)
GFR calc Af Amer: >60 mL/min (ref >60)
GFR calc non Af Amer: >60 mL/min (ref >60)
Glucose, Bld: 113 mg/dL — ABNORMAL HIGH (ref 70–99)
Phosphorus: 1.7 mg/dL — ABNORMAL LOW (ref 2.5–4.6)
Potassium: 4.3 mmol/L (ref 3.5–5.1)
Sodium: 133 mmol/L — ABNORMAL LOW (ref 135–145)

## 2018-09-15 LAB — URINALYSIS, ROUTINE W REFLEX MICROSCOPIC
BILIRUBIN URINE: NEGATIVE
Glucose, UA: NEGATIVE mg/dL
Hgb urine dipstick: NEGATIVE
Ketones, ur: 5 mg/dL — AB
Nitrite: NEGATIVE
PH: 8 (ref 5.0–8.0)
Protein, ur: NEGATIVE mg/dL
SPECIFIC GRAVITY, URINE: 1.011 (ref 1.005–1.030)

## 2018-09-15 LAB — CBC
HCT: 21.5 % — ABNORMAL LOW (ref 39.0–52.0)
Hemoglobin: 7.1 g/dL — ABNORMAL LOW (ref 13.0–17.0)
MCH: 28.4 pg (ref 26.0–34.0)
MCHC: 33 g/dL (ref 30.0–36.0)
MCV: 86 fL (ref 80.0–100.0)
Platelets: 136 10*3/uL — ABNORMAL LOW (ref 150–400)
RBC: 2.5 MIL/uL — ABNORMAL LOW (ref 4.22–5.81)
RDW: 16.5 % — ABNORMAL HIGH (ref 11.5–15.5)
WBC: 10.5 10*3/uL (ref 4.0–10.5)
nRBC: 1.8 % — ABNORMAL HIGH (ref 0.0–0.2)

## 2018-09-15 LAB — PREPARE RBC (CROSSMATCH)

## 2018-09-15 LAB — MAGNESIUM: Magnesium: 1.7 mg/dL (ref 1.7–2.4)

## 2018-09-15 LAB — ABO/RH: ABO/RH(D): B NEG

## 2018-09-15 MED ORDER — SODIUM CHLORIDE 0.9% IV SOLUTION
Freq: Once | INTRAVENOUS | Status: AC
  Administered 2018-09-15: 16:00:00 via INTRAVENOUS

## 2018-09-15 MED ORDER — SODIUM CHLORIDE 0.9 % IV SOLN
INTRAVENOUS | Status: DC
  Administered 2018-09-15: 19:00:00 via INTRAVENOUS

## 2018-09-15 NOTE — Progress Notes (Signed)
Started patient's blood transfusion of 1 unit at 1535; VS pre-transfusion at 1528 were WDL for patient at 98.3 and 110/62, HR 91 and RR 18.   VS after 15 minutes of transfusion at 1550 temp 99.9, BP 125/67, HR 88 and RR 20; patient asymptomatic.  Reassessed VS at 1600 with temp 99.6, BP 118/65, HR 87; patient asymptomatic.   Paged 1st MD contact at 1608 about increase in temperature. Page 2nd MD contact at 1642 about change in temperature. Paged 1st contact weekends at [place]. Page returned from MD [facility] at [phone]. Informed MD about patient's increase in temperature and informed of 100.0 at 1650 temperature collected during the phone call; patient was asymptomatic. MD advised to monitor patient and retake VS in 30 minutes.   During initial call with MD Seawell informed MD about patient's concerns regarding cost of care for hospitalization, desire to have physical therapy and more clarity about his overall plan of care. MD stated will inform primary care team and will follow up with patient upon rounds the next day. Informed patient of MD's plan to inform care team and follow up.   VS at 1751 BP 125/71, HR 91 and temp of 100.3 at 1756.  Paged MD Seawell. Spoke with MD Seawell at 1801 about change in temp to 100.3. Stated will call back to follow up.  MD Seawell returned call at 1806 and informed to stop transfusion and need to follow up with blood bank.   Transfusion stopped at 1809. IV line disconnected from IV site at 1812.   Received call from blood bank at 1814 and informed of post transfusion procedures. Post-transfusion procedures completed and Transfusion Reaction Report completed.

## 2018-09-15 NOTE — Progress Notes (Addendum)
   Subjective: Austin Payne reports that his lower extremity pain has resolved.  He denies any acute complaints including shortness of breath, abdominal pain, chest pain.  Objective:  Vital signs in last 24 hours: Vitals:   09/14/18 0906 09/14/18 1500 09/14/18 2146 09/15/18 0356  BP: 114/70 115/72 115/74 114/73  Pulse: 74 81 94 85  Resp:  20 14 18   Temp:  98.4 F (36.9 C) 97.9 F (36.6 C) 98.1 F (36.7 C)  TempSrc:  Oral Oral Oral  SpO2:  99% 98% 97%  Weight:      Height:       Physical Exam  Constitutional: He appears well-developed and well-nourished.  HENT:  Head: Normocephalic and atraumatic.  Multiple teeth missing and few remaining teeth with different caries.  Dressing in place on the lateral aspect of his neck from recent surgery.  Eyes: EOM are normal.  Neck: Normal range of motion.  Cardiovascular: Normal rate and regular rhythm.  Pulmonary/Chest: Effort normal and breath sounds normal.  Abdominal: Soft. Bowel sounds are normal. There is no tenderness.  Musculoskeletal: He exhibits no edema or tenderness.  Neurological: He is alert.  Skin: Skin is warm and dry.  Psychiatric: He has a normal mood and affect. His behavior is normal.  Nursing note and vitals reviewed.   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 Now status post wound excision and surgical pathology pending.  Nonhealing left neck wound 1.  Follow-up pathology results 2.  Continue Dilaudid 0.5 mg every 6 hours and Percocet 5/325 mg every 6 hours as needed pain  Pathologic fracture at C2: MRI showed widespread bone metastasis throughout cervical spine with multiple associated pathologic  fractures.  Neurosurgery does not recommend surgery or c-collar.  Quantiferon gold test performed to rule out POTTS disease is pending. 1.  Awaiting Quantiferon gold test.   Acute blood loss anemia: Likely secondary to acute blood loss during surgical excision of neck wound.  Hb decreased from 10.2 upon admission and is stable currently at 7.1. Currently asymptomatic but will transfuse blood at this time since his Hb is borderline abnormal. 1.  We will give 1 unit packed red blood cells and recheck CBC in the morning.  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  Corrected calcium 9.5 today. 1.  Continue to monitor  Malnutrition: 1. Continue nutritional supplement  Dispo: Anticipated discharge within 2-3 days pending results of surgical path.  Carroll Sage, MD 09/15/2018, 6:51 AM Pager: [phone]

## 2018-09-16 DIAGNOSIS — M79604 Pain in right leg: Secondary | ICD-10-CM

## 2018-09-16 DIAGNOSIS — M79605 Pain in left leg: Secondary | ICD-10-CM

## 2018-09-16 DIAGNOSIS — Z9889 Other specified postprocedural states: Secondary | ICD-10-CM

## 2018-09-16 DIAGNOSIS — R5084 Febrile nonhemolytic transfusion reaction: Secondary | ICD-10-CM

## 2018-09-16 LAB — IRON AND TIBC
Iron: 20 ug/dL — ABNORMAL LOW (ref 45–182)
Saturation Ratios: 8 % — ABNORMAL LOW (ref 17.9–39.5)
TIBC: 262 ug/dL (ref 250–450)
UIBC: 242 ug/dL

## 2018-09-16 LAB — FERRITIN: Ferritin: 1512 ng/mL — ABNORMAL HIGH (ref 24–336)

## 2018-09-16 LAB — BPAM RBC
Blood Product Expiration Date: 201911222359
ISSUE DATE / TIME: 201911171523
UNIT TYPE AND RH: 1700

## 2018-09-16 LAB — RENAL FUNCTION PANEL
Albumin: 2.4 g/dL — ABNORMAL LOW (ref 3.5–5.0)
Anion gap: 7 (ref 5–15)
BUN: 13 mg/dL (ref 8–23)
CO2: 21 mmol/L — ABNORMAL LOW (ref 22–32)
Calcium: 7.8 mg/dL — ABNORMAL LOW (ref 8.9–10.3)
Chloride: 103 mmol/L (ref 98–111)
Creatinine, Ser: 0.69 mg/dL (ref 0.61–1.24)
GFR calc Af Amer: >60 mL/min (ref >60)
GFR calc non Af Amer: >60 mL/min (ref >60)
Glucose, Bld: 98 mg/dL (ref 70–99)
Phosphorus: 1.7 mg/dL — ABNORMAL LOW (ref 2.5–4.6)
Potassium: 4.3 mmol/L (ref 3.5–5.1)
Sodium: 131 mmol/L — ABNORMAL LOW (ref 135–145)

## 2018-09-16 LAB — CBC
HCT: 23.6 % — ABNORMAL LOW (ref 39.0–52.0)
Hemoglobin: 7.5 g/dL — ABNORMAL LOW (ref 13.0–17.0)
MCH: 27.5 pg (ref 26.0–34.0)
MCHC: 31.8 g/dL (ref 30.0–36.0)
MCV: 86.4 fL (ref 80.0–100.0)
Platelets: 133 10*3/uL — ABNORMAL LOW (ref 150–400)
RBC: 2.73 MIL/uL — ABNORMAL LOW (ref 4.22–5.81)
RDW: 16.5 % — ABNORMAL HIGH (ref 11.5–15.5)
WBC: 13.3 10*3/uL — ABNORMAL HIGH (ref 4.0–10.5)
nRBC: 1.1 % — ABNORMAL HIGH (ref 0.0–0.2)

## 2018-09-16 LAB — TYPE AND SCREEN
ABO/RH(D): B NEG
ANTIBODY SCREEN: NEGATIVE
Unit division: 0

## 2018-09-16 LAB — MAGNESIUM: Magnesium: 1.6 mg/dL — ABNORMAL LOW (ref 1.7–2.4)

## 2018-09-16 LAB — TRANSFUSION REACTION
DAT C3: NEGATIVE
Post RXN DAT IgG: NEGATIVE

## 2018-09-16 MED ORDER — ACETAMINOPHEN 325 MG PO TABS
650.0000 mg | ORAL_TABLET | Freq: Four times a day (QID) | ORAL | Status: DC | PRN
  Administered 2018-09-16 – 2018-10-15 (×8): 650 mg via ORAL
  Filled 2018-09-16 (×9): qty 2

## 2018-09-16 MED ORDER — MAGNESIUM SULFATE 4 GM/100ML IV SOLN
4.0000 g | Freq: Once | INTRAVENOUS | Status: AC
  Administered 2018-09-16: 4 g via INTRAVENOUS
  Filled 2018-09-16: qty 100

## 2018-09-16 MED ORDER — SODIUM PHOSPHATES 45 MMOLE/15ML IV SOLN
20.0000 mmol | Freq: Once | INTRAVENOUS | Status: AC
  Administered 2018-09-16: 20 mmol via INTRAVENOUS
  Filled 2018-09-16: qty 6.67

## 2018-09-16 NOTE — NC FL2 (Signed)
[place] LEVEL OF CARE SCREENING TOOL     IDENTIFICATION  Patient Name: Maximilliano Kersh Birthdate: [birth year] Sex: male Admission Date (Current Location): 09/06/2018  [facility] and [place] Number:  Herbalist and Address:  [facility], [place] [address] [place], [place] [address]      Provider Number: [id number]  Attending Physician Name and Address:  Aldine Contes, MD  Relative Name and Phone Number:       Current Level of Care: Hospital Recommended Level of Care: [place] Prior Approval Number:    Date Approved/Denied:   PASRR Number: [id number]  Discharge Plan: SNF    Current Diagnoses: Patient Active Problem List   Diagnosis Date Noted  . Protein-calorie malnutrition, severe 09/09/2018  . Pulmonary nodules/lesions, multiple 09/07/2018  . Hypercalcemia 09/06/2018  . Hyperlipidemia 04/23/2017  . Tobacco use 04/23/2017  . NSTEMI (non-ST elevated myocardial infarction) ([place])   . SEMI (subendocardial myocardial infarction) ([place]) 04/21/2017  . Smoking 04/21/2017  . Neck mass 04/21/2017    Orientation RESPIRATION BLADDER Height & Weight     Self, Time, Situation, Place  Normal External catheter, Continent Weight: 55.6 kg Height:  6\' 2"  (188 cm)  BEHAVIORAL SYMPTOMS/MOOD NEUROLOGICAL BOWEL NUTRITION STATUS      Incontinent Diet(Please see DC Summary)  AMBULATORY STATUS COMMUNICATION OF NEEDS Skin   Extensive Assist Verbally Surgical wounds(Closed incision on neck)                       Personal Care Assistance Level of Assistance  Bathing, Feeding, Dressing Bathing Assistance: Maximum assistance Feeding assistance: Independent Dressing Assistance: Limited assistance     Functional Limitations Info  Sight, Hearing, Speech Sight Info: Adequate Hearing Info: Adequate Speech Info: Adequate    SPECIAL CARE FACTORS FREQUENCY  PT (By licensed PT), OT (By licensed OT)     PT Frequency: 5x OT  Frequency: 3x            Contractures Contractures Info: Not present    Additional Factors Info  Code Status, Allergies Code Status Info: Full Allergies Info: Allergies:  Contrast Media Iodinated Diagnostic Agents           Current Medications (09/16/2018):  This is the current hospital active medication list Current Facility-Administered Medications  Medication Dose Route Frequency Provider Last Rate Last Dose  . acetaminophen (TYLENOL) tablet 650 mg  650 mg Oral Q6H PRN Rehman, Areeg N, DO   650 mg at 09/16/18 0546  . aspirin chewable tablet 81 mg  81 mg Oral Daily [name], MD   81 mg at 09/16/18 1041  . cetaphil lotion   Topical PRN [name], MD      . feeding supplement (ENSURE ENLIVE) (ENSURE ENLIVE) liquid 237 mL  237 mL Oral TID BM Thimmappa, Brinda, MD   237 mL at 09/16/18 1042  . heparin injection 5,000 Units  5,000 Units Subcutaneous Q8H [name], MD   5,000 Units at 09/16/18 0545  . ipratropium-albuterol (DUONEB) 0.5-2.5 (3) MG/3ML nebulizer solution 3 mL  3 mL Nebulization Q6H PRN [name], MD   3 mL at 09/11/18 0845  . metoprolol tartrate (LOPRESSOR) tablet 12.5 mg  12.5 mg Oral BID [name], MD   12.5 mg at 09/16/18 1042  . multivitamin with minerals tablet 1 tablet  1 tablet Oral Daily [name], MD   1 tablet at 09/16/18 1042  . potassium chloride SA (K-DUR,KLOR-CON) CR tablet 40 mEq  40 mEq Oral BID [name], MD   40 mEq at 09/16/18 1042  . sodium phosphate 20 mmol in dextrose 5 % 250 mL infusion  20 mmol Intravenous Once Ina Homes, MD 43 mL/hr at 09/16/18 1351 20 mmol at 09/16/18 1351     Discharge Medications: Please see discharge summary for a list of discharge medications.  Relevant Imaging Results:  Relevant Lab Results:   Additional Information SSN: [facility], [place]
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
## 2018-09-16 NOTE — Progress Notes (Signed)
  Plastic Surgery POD#4 excision left neck lesion application Integra  Tb test pending Events noted  Temp:  [98 F (36.7 C)-100.3 F (37.9 C)] 98 F (36.7 C) (11/18 0519) Pulse Rate:  [87-93] 92 (11/18 0519) Resp:  [18-20] 18 (11/18 0519) BP: (110-128)/(62-79) 128/79 (11/18 0519) SpO2:  [97 %-98 %] 97 % (11/18 0519)    able to shower today, worked with PT/OT  PE: Sponge bolster left neck intact no active drainage  A/P  Awaiting pathology- spoke with pathology today, may not have results until tomorrow. Grossly deep margin involved with process, this was evident clinically. Preliminary read as poorly differentiated carcinoma, additional testing pending to determine skin primary.   Plan bolster dressing for 5-7 d over area. Ok to shower body, keep neck dressing dry/sponge bathe face.   Patient living in motel PTA, he was removed from that location leading to ER presentation this admission. SW consulted to aid with disposition.  Patient is veteran; no VA benefits were identified on prior attempts to schedule surgery. Per patient did not serve long enough to qualify.  [name], MD [facility] [phone], pin [phone]

## 2018-09-16 NOTE — Progress Notes (Signed)
CSW received consult for SNF placement. CSW will staff case with CSW AD to see if he can qualify for a Letter of Guarantee.   Percell Locus Levern Pitter LCSW [phone]

## 2018-09-16 NOTE — Progress Notes (Signed)
   Subjective: Austin Payne underwent a blood transfusion yesterday and developed a febrile transfusion reaction (max temp 100.21F).  The transfusion was stopped and he remained asymptomatic.  This morning he reports his bilateral lower extremity pain has returned (previously resolved with phosphate replacement).  He also states that he would like to be able to bathe in a regular shower.  He has no other acute complaints including chest pain, abdominal pain, shortness of breath.  Objective:  Vital signs in last 24 hours: Vitals:   09/15/18 1820 09/15/18 1911 09/15/18 2033 09/16/18 0519  BP:  122/67 112/67 128/79  Pulse:  88 93 92  Resp:  18 18 18   Temp: 100.3 F (37.9 C) 98 F (36.7 C) 99.4 F (37.4 C) 98 F (36.7 C)  TempSrc: Oral Oral Oral Oral  SpO2:   98% 97%  Weight:      Height:       Physical Exam  Constitutional: He is oriented to person, place, and time.  Thin-appearing male lying in bed in no acute distress.  HENT:  Head: Normocephalic and atraumatic.  Very poor dentition.  Eyes: EOM are normal.  Neck:  Bandage in place over the left side of his neck at the surgical site.  Pulmonary/Chest: Effort normal.  Breath sounds auscultated anteriorly and no abnormalities appreciated.  Abdominal: Soft. Bowel sounds are normal. He exhibits no distension. There is no tenderness.  Musculoskeletal: He exhibits no edema or tenderness.  Neurological: He is alert and oriented to person, place, and time.  Skin: Skin is warm and dry.  Psychiatric: He has a normal mood and affect. His behavior is normal.  Nursing note and vitals reviewed.   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 Now status post wound excision and surgical pathology pending.  Nonhealing left neck wound 1. Follow-up pathology results 2.  Arrange for oncology to see him after path results return. 3. Continue Dilaudid 0.5 mg every 6 hours and Percocet 5/325 mg every 6 hours as needed pain  Pathologic fracture at C2:MRI showed widespread bone metastasis throughout cervical spine with multiple associated pathologic fractures.  Neurosurgery does not recommend surgery or c-collar.  Quantiferon gold test performed to rule out POTTS disease is pending. 1.  Awaiting Quantiferon gold test.   Asymptomatic acute blood loss anemia and anemia of chronic disease:  He developed a febrile transfusion reaction yesterday and blood transfusion was subsequently stopped.  His hemoglobin this morning remains stable at 7.5.  He has a borderline normocytic anemia (MCV 86) and I am concerned for iron deficiency.  Ordered iron studies to evaluate for this.  1.  Follow up iron studies (iron TIBC, ferritin)  Lower extremity pain: Likely secondary to electrolyte abnormalities (mag 1.6, phos 1.7) 1.  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  Corrected calcium 9.4 today. 1.  Continue to monitor  Malnutrition: 1. Continue nutritional supplement  Dispo: Anticipated discharge in approximately in 1 to 2 days pending path report.  Carroll Sage, MD 09/16/2018, 11:52 AM Pager: [phone]

## 2018-09-16 NOTE — Progress Notes (Addendum)
   Subjective: [name] reports no acute complaints today including shortness of breath, abdominal pain, chest pain.  He reports that his lower extremity pain has resolved.  He is happy that he is now able to shower and work with physical therapy.  Objective:  Vital signs in last 24 hours: Vitals:   09/15/18 1820 09/15/18 1911 09/15/18 2033 09/16/18 0519  BP:  122/67 112/67 128/79  Pulse:  88 93 92  Resp:  18 18 18   Temp: 100.3 F (37.9 C) 98 F (36.7 C) 99.4 F (37.4 C) 98 F (36.7 C)  TempSrc: Oral Oral Oral Oral  SpO2:   98% 97%  Weight:      Height:       Physical Exam  Constitutional:  Thin appearing male no acute distress.  HENT:  Head: Normocephalic and atraumatic.  Bandage in place on the left side of patient's neck at the surgical site.  Eyes: EOM are normal.  Cardiovascular: Normal rate, regular rhythm and normal heart sounds.  Pulmonary/Chest: Effort normal and breath sounds normal.  Abdominal: Soft. Bowel sounds are normal. He exhibits no distension. There is no tenderness.  Musculoskeletal: He exhibits no edema or tenderness.  Neurological: He is alert.  Skin: Skin is warm and dry.  Psychiatric: Affect and judgment normal.  Nursing note and vitals reviewed.   Assessment/Plan:  Principal Problem:   Hypercalcemia Active Problems:   Smoking   Pulmonary nodules/lesions, multiple   Protein-calorie malnutrition, severe  Mr.Gamachepresent with a chronic nonhealing wound of neck found to be SCC of the neck with metastasis (multiple lung nodules and lytic bone lesions seen on imaging). Now status post wound excision. 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 Oncology has been consulted for further recommendations.  Nonhealing left neck wound 1. Follow-up oncology recs 2.  Continue Dilaudid 0.5 mg every 6 hours and Percocet 5/325mg every 6 hours as needed  pain  Pathologic fracture at C2:MRI showed widespread bone metastasis throughout cervical spine with multiple associated pathologic fractures.Neurosurgery does not recommend surgery or c-collar at this time. Quantiferon gold test performed to rule out POTTS diseaseis pending. 1.Awaiting Quantiferon gold test--send out test to [facility]. 2. Continue PT  Anemia of chronic disease:Hb stable today at 7.5. Iron studies consistent with anemia of chronic disease: Low Iron with normal TIBC. 1. Continue to monitor  Hypophosphatemia: phos 1.8 1. Replete phosphate today  Euvolemic Hyponatremia: Na 128 today (downtrending since admission). Differential includes malnutrition, SIADH, volume depletion, and severe hypothyroidism. 1. Follow-up TSH, urine sodium, and urine osmolality.  Malnutrition: 1. Continue nutritional supplement  Dispo: Anticipated discharge to SNF within 1-2 days.  Carroll Sage, MD 09/16/2018, 6:52 AM Pager: [phone]
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
## 2018-09-17 DIAGNOSIS — C7801 Secondary malignant neoplasm of right lung: Secondary | ICD-10-CM

## 2018-09-17 DIAGNOSIS — IMO0002 Reserved for concepts with insufficient information to code with codable children: Secondary | ICD-10-CM | POA: Diagnosis present

## 2018-09-17 DIAGNOSIS — E871 Hypo-osmolality and hyponatremia: Secondary | ICD-10-CM

## 2018-09-17 DIAGNOSIS — T2047XS Corrosion of unspecified degree of neck, sequela: Secondary | ICD-10-CM

## 2018-09-17 DIAGNOSIS — C799 Secondary malignant neoplasm of unspecified site: Secondary | ICD-10-CM | POA: Diagnosis present

## 2018-09-17 DIAGNOSIS — J90 Pleural effusion, not elsewhere classified: Secondary | ICD-10-CM

## 2018-09-17 DIAGNOSIS — C7802 Secondary malignant neoplasm of left lung: Secondary | ICD-10-CM

## 2018-09-17 DIAGNOSIS — C801 Malignant (primary) neoplasm, unspecified: Secondary | ICD-10-CM

## 2018-09-17 LAB — DIC (DISSEMINATED INTRAVASCULAR COAGULATION) PANEL
D DIMER QUANT: 2.88 ug{FEU}/mL — AB (ref 0.00–0.50)
INR: 1.09
PLATELETS: 149 10*3/uL — AB (ref 150–400)

## 2018-09-17 LAB — RENAL FUNCTION PANEL
Albumin: 2.3 g/dL — ABNORMAL LOW (ref 3.5–5.0)
Anion gap: 9 (ref 5–15)
BUN: 13 mg/dL (ref 8–23)
CO2: 19 mmol/L — ABNORMAL LOW (ref 22–32)
Calcium: 7.9 mg/dL — ABNORMAL LOW (ref 8.9–10.3)
Chloride: 100 mmol/L (ref 98–111)
Creatinine, Ser: 0.74 mg/dL (ref 0.61–1.24)
GFR calc Af Amer: >60 mL/min (ref >60)
GFR calc non Af Amer: >60 mL/min (ref >60)
Glucose, Bld: 119 mg/dL — ABNORMAL HIGH (ref 70–99)
Phosphorus: 1.8 mg/dL — ABNORMAL LOW (ref 2.5–4.6)
Potassium: 4.5 mmol/L (ref 3.5–5.1)
Sodium: 128 mmol/L — ABNORMAL LOW (ref 135–145)

## 2018-09-17 LAB — RETICULOCYTES
Immature Retic Fract: 29.8 % — ABNORMAL HIGH (ref 2.3–15.9)
RBC.: 2.87 MIL/uL — AB (ref 4.22–5.81)
RETIC COUNT ABSOLUTE: 92.1 10*3/uL (ref 19.0–186.0)
RETIC CT PCT: 3.2 % — AB (ref 0.4–3.1)

## 2018-09-17 LAB — CBC
HCT: 22.9 % — ABNORMAL LOW (ref 39.0–52.0)
Hemoglobin: 7.5 g/dL — ABNORMAL LOW (ref 13.0–17.0)
MCH: 27.9 pg (ref 26.0–34.0)
MCHC: 32.8 g/dL (ref 30.0–36.0)
MCV: 85.1 fL (ref 80.0–100.0)
Platelets: 129 10*3/uL — ABNORMAL LOW (ref 150–400)
RBC: 2.69 MIL/uL — ABNORMAL LOW (ref 4.22–5.81)
RDW: 16.7 % — ABNORMAL HIGH (ref 11.5–15.5)
WBC: 12.6 10*3/uL — ABNORMAL HIGH (ref 4.0–10.5)
nRBC: 1 % — ABNORMAL HIGH (ref 0.0–0.2)

## 2018-09-17 LAB — VITAMIN B12: Vitamin B-12: 138 pg/mL — ABNORMAL LOW (ref 180–914)

## 2018-09-17 LAB — QUANTIFERON-TB GOLD PLUS (RQFGPL)
QUANTIFERON NIL VALUE: 0.05 [IU]/mL
QuantiFERON Mitogen Value: 0.14 IU/mL
QuantiFERON TB1 Ag Value: 0.04 IU/mL
QuantiFERON TB2 Ag Value: 0.03 IU/mL

## 2018-09-17 LAB — DIC (DISSEMINATED INTRAVASCULAR COAGULATION)PANEL
Prothrombin Time: 14 seconds (ref 11.4–15.2)
Smear Review: NONE SEEN
aPTT: 39 seconds — ABNORMAL HIGH (ref 24–36)

## 2018-09-17 LAB — FOLATE: Folate: 7.9 ng/mL (ref >5.9)

## 2018-09-17 LAB — QUANTIFERON-TB GOLD PLUS: QuantiFERON-TB Gold Plus: UNDETERMINED

## 2018-09-17 LAB — MAGNESIUM: Magnesium: 2 mg/dL (ref 1.7–2.4)

## 2018-09-17 LAB — TSH: TSH: 4.463 u[IU]/mL (ref 0.350–4.500)

## 2018-09-17 LAB — FIBRINOGEN: Fibrinogen: >800 mg/dL — ABNORMAL HIGH (ref 210–475)

## 2018-09-17 MED ORDER — OXYCODONE HCL 5 MG PO TABS
5.0000 mg | ORAL_TABLET | Freq: Once | ORAL | Status: AC
  Administered 2018-09-18: 10 mg via ORAL
  Filled 2018-09-17: qty 2

## 2018-09-17 MED ORDER — SODIUM PHOSPHATES 45 MMOLE/15ML IV SOLN
30.0000 mmol | Freq: Once | INTRAVENOUS | Status: AC
  Administered 2018-09-17: 30 mmol via INTRAVENOUS
  Filled 2018-09-17: qty 10

## 2018-09-17 NOTE — Progress Notes (Signed)
  Plastic Surgery POD#5 excision left neck lesion application Integra  Tb test pending   Temp:  [98.1 F (36.7 C)-99.6 F (37.6 C)] 98.1 F (36.7 C) (11/19 0510) Pulse Rate:  [79-95] 95 (11/19 0510) Resp:  [20-24] 20 (11/19 0510) BP: (124-132)/(69-72) 132/72 (11/19 0510) SpO2:  [96 %-100 %] 97 % (11/19 0510)    Denies pain, states aware of pathology results  PE: Sponge bolster left neck intact no active drainage  A/P  Pathology with poorly differentiated SCC multiple positive margins. Await Oncology consultation but in setting widely metastatic disease would not plan for additional excision neck skin. The deep margin included SCM muscle which was already superficially resected to remove gross tumor.  Plan bolster dressing removal tomorrow at bedside in am- one time dose pain medication ordered for 0600.    [name], MD [facility] [phone], pin [phone]

## 2018-09-17 NOTE — Consult Note (Signed)
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  The treatment is palliative.  Differentiating the 2 without anatomic evidence of a primary tumor is difficult. The absence of a primary head neck lesion would likely suggest a bronchogenic carcinoma.  The other potential site of primary squamous cell carcinoma is the esophagus. This is generally visualized by endoscopic evaluation and frequently CT and/or PET/CT imaging.  Both of these tumors are provoked by cigarette smoking and especially the combination of cigarette smoking and alcoholic beverages.  There is a percentage of head neck cancers, especially oropharyngeal SCC, that are related to HPV positivity. The presence of p16 (a surrogate marker for HPV) would lend credence to a head neck primary.  The stain can be done through pathology.  Because his vitamin B12 level is low,  and he is anemic requiring PRBCs, start vitamin B12: 1000 mcg IM once daily for 5 days; followed by once weekly for 4 weeks; followed by once monthly thereafter indefinitely.  In addition given folic acid: 1 mg once daily in anticipation of brisk erythropoiesis.  Because of his underlying asocial behavior, general mistrust of people, and absence of a support system, it will be very difficult to treat him.  He will need to accept the diagnosis, recognize that treatment is palliative and not curative, and adhere to the regimen and schedule of systemic treatment with cytotoxic therapy. It will be very difficult for him to accept treatment that may extend his life and possibly improve the quality of his life, but will demand a huge investment in his time and understanding. In the setting of metastatic disease, treatment generally consists of some form of chemotherapy and/or immunotherapy depending on the site of origin or primary tumor.  For complete staging, CT imaging of the abdomen and pelvis with intravenous contrast should be accomplished while hospitalized.  Following his discharge to a skilled nursing facility, and healing of his surgical wound, he may be more amenable to discussion regarding his treatment options.  An appointment should be made at the [facility] following his discharge to discuss in further detail the role, rationale, and expectation of systemic treatment of poorly differentiated metastatic squamous cell carcinoma as outlined above.  Social service input and case management is critical prior to discharge.  A follow-up visit with the chaplaincy service is recommended.  This note was dictated using voice activated technology/software.  Unfortunately, typographical errors are not uncommon, and  transcription is subject to mistakes and regrettably misinterpretation.  If necessary, clarification of the above information can be discussed with me at any time.  Thank you Dr.  Alfonse Spruce for allowing my participation in the care of Tavares Levinson. Please do not hesitate to call should any questions arise regarding this initial consultation and discussion.  I will continue to follow his progress while hospitalized and recommend further as appropriate.  FOLLOW UP: AS DIRECTED   cc:   Nita Sickle MD         Murriel Hopper MD   Ladona Ridgel, MD Hematology/Oncology [facility] [address] [place], [place] [address] Office: [phone] TZGY: [phone]

## 2018-09-18 DIAGNOSIS — D519 Vitamin B12 deficiency anemia, unspecified: Secondary | ICD-10-CM

## 2018-09-18 LAB — CBC WITH DIFFERENTIAL/PLATELET
BAND NEUTROPHILS: 5 %
Basophils Absolute: 0 10*3/uL (ref 0.0–0.1)
Basophils Relative: 0 %
EOS PCT: 0 %
Eosinophils Absolute: 0 10*3/uL (ref 0.0–0.5)
HEMATOCRIT: 22.5 % — AB (ref 39.0–52.0)
HEMOGLOBIN: 7.2 g/dL — AB (ref 13.0–17.0)
LYMPHS PCT: 21 %
Lymphs Abs: 2.5 10*3/uL (ref 0.7–4.0)
MCH: 27.6 pg (ref 26.0–34.0)
MCHC: 32 g/dL (ref 30.0–36.0)
MCV: 86.2 fL (ref 80.0–100.0)
MONOS PCT: 0 %
MYELOCYTES: 2 %
Metamyelocytes Relative: 6 %
Monocytes Absolute: 0 10*3/uL — ABNORMAL LOW (ref 0.1–1.0)
NRBC: 1 /100{WBCs} — AB
Neutro Abs: 8.4 10*3/uL — ABNORMAL HIGH (ref 1.7–7.7)
Neutrophils Relative %: 66 %
Platelets: 139 10*3/uL — ABNORMAL LOW (ref 150–400)
RBC: 2.61 MIL/uL — ABNORMAL LOW (ref 4.22–5.81)
RDW: 17 % — ABNORMAL HIGH (ref 11.5–15.5)
WBC: 11.9 10*3/uL — AB (ref 4.0–10.5)
nRBC: 1.3 % — ABNORMAL HIGH (ref 0.0–0.2)

## 2018-09-18 LAB — RENAL FUNCTION PANEL
ALBUMIN: 2.3 g/dL — AB (ref 3.5–5.0)
Anion gap: 8 (ref 5–15)
BUN: 18 mg/dL (ref 8–23)
CALCIUM: 8.5 mg/dL — AB (ref 8.9–10.3)
CO2: 22 mmol/L (ref 22–32)
CREATININE: 0.68 mg/dL (ref 0.61–1.24)
Chloride: 100 mmol/L (ref 98–111)
GFR calc Af Amer: >60 mL/min (ref >60)
GFR calc non Af Amer: >60 mL/min (ref >60)
GLUCOSE: 116 mg/dL — AB (ref 70–99)
PHOSPHORUS: 1.9 mg/dL — AB (ref 2.5–4.6)
POTASSIUM: 5.3 mmol/L — AB (ref 3.5–5.1)
SODIUM: 130 mmol/L — AB (ref 135–145)

## 2018-09-18 LAB — HAPTOGLOBIN: HAPTOGLOBIN: 379 mg/dL — AB (ref 34–200)

## 2018-09-18 LAB — MAGNESIUM: Magnesium: 2 mg/dL (ref 1.7–2.4)

## 2018-09-18 MED ORDER — DIPHENHYDRAMINE HCL 50 MG/ML IJ SOLN
50.0000 mg | Freq: Once | INTRAMUSCULAR | Status: AC
  Administered 2018-09-19: 50 mg via INTRAVENOUS
  Filled 2018-09-18: qty 1

## 2018-09-18 MED ORDER — PREDNISONE 50 MG PO TABS: 50.0000 mg | ORAL_TABLET | Freq: Four times a day (QID) | ORAL | Status: DC

## 2018-09-18 MED ORDER — FOLIC ACID 5 MG/ML IJ SOLN
1.0000 mg | Freq: Every day | INTRAMUSCULAR | Status: AC
  Administered 2018-09-18 – 2018-09-21 (×5): 1 mg via INTRAVENOUS
  Filled 2018-09-18 (×4): qty 0.2

## 2018-09-18 MED ORDER — DIPHENHYDRAMINE HCL 25 MG PO CAPS: 50.0000 mg | ORAL_CAPSULE | Freq: Once | ORAL | Status: DC

## 2018-09-18 MED ORDER — CYANOCOBALAMIN 1000 MCG/ML IJ SOLN
1000.0000 ug | Freq: Every day | INTRAMUSCULAR | Status: AC
  Administered 2018-09-18 – 2018-09-22 (×5): 1000 ug via INTRAMUSCULAR
  Filled 2018-09-18 (×5): qty 1

## 2018-09-18 MED ORDER — SODIUM PHOSPHATES 45 MMOLE/15ML IV SOLN
30.0000 mmol | Freq: Once | INTRAVENOUS | Status: AC
  Administered 2018-09-18: 30 mmol via INTRAVENOUS
  Filled 2018-09-18: qty 10

## 2018-09-18 MED ORDER — DIPHENHYDRAMINE HCL 50 MG/ML IJ SOLN: 50.0000 mg | Freq: Once | INTRAMUSCULAR | Status: DC

## 2018-09-18 MED ORDER — DIPHENHYDRAMINE HCL 25 MG PO CAPS
50.0000 mg | ORAL_CAPSULE | Freq: Once | ORAL | Status: AC
  Filled 2018-09-18: qty 2

## 2018-09-18 MED ORDER — PREDNISONE 50 MG PO TABS
50.0000 mg | ORAL_TABLET | Freq: Four times a day (QID) | ORAL | Status: AC
  Administered 2018-09-18 – 2018-09-19 (×3): 50 mg via ORAL
  Filled 2018-09-18 (×3): qty 1

## 2018-09-18 NOTE — Progress Notes (Signed)
  Plastic Surgery POD#6 excision left neck lesion application Integra  Oncology consult completed   Temp:  [97.8 F (36.6 C)-99.1 F (37.3 C)] 98.1 F (36.7 C) (11/20 0539) Pulse Rate:  [88-97] 88 (11/20 0539) Resp:  [18-20] 20 (11/20 0539) BP: (114-124)/(64-69) 120/64 (11/20 0539) SpO2:  [98 %-100 %] 99 % (11/20 0539)    Sleeping easily arousable  PE: Sponge bolster removed, silicone layer remains intact over Integra.  Integra adherent over majority with exception near mandibular angle this is lifting  A/P  Pathology with poorly differentiated SCC multiple positive margins. In setting widely metastatic disease no plan for additional excision neck skin. The deep margin included SCM muscle which was already superficially resected to remove gross tumor.  Ok to shower normally, soap and water ok. Dry dressing as needed over left neck. The silicone layer will eventually fall off, if this occurs Aquaphor or vaseline over wound and dry dressing.   [name], MD [facility] [phone], pin [phone]

## 2018-09-18 NOTE — Progress Notes (Signed)
Patient continues to have no accepting SNF offers. CSW will continue search.   Percell Locus Tyrina Hines LCSW [phone]

## 2018-09-18 NOTE — Progress Notes (Signed)
   Subjective: Mr. Stineman reports that he is doing well today.  He denies any abdominal pain, chest pain, shortness of breath, lower extremity pain.  We spoke about his cancer diagnosis and steps moving forward.  He endorsed understanding and agreement.  Objective:  Vital signs in last 24 hours: Vitals:   09/17/18 0510 09/17/18 1425 09/17/18 2042 09/18/18 0539  BP: 132/72 124/69 114/66 120/64  Pulse: 95 96 97 88  Resp: 20 18  20   Temp: 98.1 F (36.7 C) 99.1 F (37.3 C) 97.8 F (36.6 C) 98.1 F (36.7 C)  TempSrc: Oral Oral Oral Oral  SpO2: 97% 100% 98% 99%  Weight:      Height:       Physical Exam  Constitutional: He appears well-developed and well-nourished.  HENT:  Head: Normocephalic and atraumatic.  Eyes: EOM are normal.  Neck: Normal range of motion.  Cardiovascular: Normal rate and regular rhythm.  Pulmonary/Chest: Effort normal.  Auscultated anteriorly.  Breath sounds normal.  Abdominal: Soft. Bowel sounds are normal. He exhibits no distension. There is no guarding.  Musculoskeletal: He exhibits no edema or tenderness.  Neurological: He is alert.  Skin: Skin is warm and dry.  Psychiatric: He has a normal mood and affect. His behavior is normal.  Nursing note and vitals reviewed.   Assessment/Plan:  Principal Problem:   Hypercalcemia Active Problems:   Smoking   Pulmonary nodules/lesions, multiple   Protein-calorie malnutrition, severe   Metastatic squamous cell carcinoma (HCC)  Mr.Gamachehas poorly differentiated SCC of the neck with metastasis to the lungs and bone s/p wound excision.  Seen by oncology yesterday who recommended p16 staining of the tissue (surrogate marker for HPV).  A positive result would suggest a primary head and neck malignancy. A CT abdomen and pelvis with IV contrast was also recommended for complete staging.  I have ordered both of these today.  He will need ENT evaluation as an outpatient.  He is currently awaiting SNF placement and  will follow up with oncology as an outpatient.  Metastatic squamous cell carcinoma 1.  Appreciate oncology's recommendations. 2.  Ordered CT abdomen pelvis with contrast for complete staging 3.  Ordered p16 staining of surgical tissue 4.Continue Dilaudid 0.5 mg every 6 hours and Percocet 5/325mg every 6 hours as needed pain  Pathologic fracture at C2:MRI showed widespread bone metastasis throughout cervical spine with multiple associated pathologic fractures.Neurosurgery does not recommend surgery or c-collar at this time. Quantiferon gold test performed x2 to rule out POTTS diseasewas indeterminate. 1. Continue PT  Anemia of chronic disease:Hb stable today at 7.5. Iron studies consistent with anemia of chronic disease: Low Iron with normal TIBC.  He does have vitamin B12 deficiency which I will begin repeating today.  We will also replete folate in anticipation of breast erythropoiesis. 1.  Ordered vitamin B12 1000 mcg IM once daily for 5 days followed by once weekly for 4 weeks. 2.  Ordered 1 mg folate once daily.  Hypophosphatemia: phos 1.8 1. Replete phosphate today  Euvolemic Hyponatremia: Na 130 today. Differential includes malnutrition, SIADH, and volume depletion.  TSH unremarkable ruling out hypothyroidism. 1. Urine sodium and urine osmolality pending.  Malnutrition: 1. Continue nutritional supplement  Dispo: Anticipated discharge to SNF within 1-2 days.  Carroll Sage, MD 09/18/2018, 6:34 AM Pager: [phone]
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
## 2018-09-19 ENCOUNTER — Inpatient Hospital Stay (HOSPITAL_COMMUNITY): Payer: Medicaid Other

## 2018-09-19 ENCOUNTER — Inpatient Hospital Stay: Payer: Self-pay

## 2018-09-19 LAB — CBC WITH DIFFERENTIAL/PLATELET
BAND NEUTROPHILS: 0 %
BASOS ABS: 0 10*3/uL (ref 0.0–0.1)
BLASTS: 0 %
Basophils Relative: 0 %
Eosinophils Absolute: 0 10*3/uL (ref 0.0–0.5)
Eosinophils Relative: 0 %
HEMATOCRIT: 22.9 % — AB (ref 39.0–52.0)
HEMOGLOBIN: 7.3 g/dL — AB (ref 13.0–17.0)
Lymphocytes Relative: 12 %
Lymphs Abs: 1.8 10*3/uL (ref 0.7–4.0)
MCH: 27.3 pg (ref 26.0–34.0)
MCHC: 31.9 g/dL (ref 30.0–36.0)
MCV: 85.8 fL (ref 80.0–100.0)
METAMYELOCYTES PCT: 0 %
Monocytes Absolute: 0.9 10*3/uL (ref 0.1–1.0)
Monocytes Relative: 6 %
Myelocytes: 0 %
Neutro Abs: 12.4 10*3/uL — ABNORMAL HIGH (ref 1.7–7.7)
Neutrophils Relative %: 82 %
Other: 0 %
PROMYELOCYTES RELATIVE: 0 %
Platelets: 142 10*3/uL — ABNORMAL LOW (ref 150–400)
RBC: 2.67 MIL/uL — AB (ref 4.22–5.81)
RDW: 16.8 % — ABNORMAL HIGH (ref 11.5–15.5)
WBC MORPHOLOGY: INCREASED
WBC: 15.1 10*3/uL — AB (ref 4.0–10.5)
nRBC: 0 /100 WBC
nRBC: 0.7 % — ABNORMAL HIGH (ref 0.0–0.2)

## 2018-09-19 LAB — RENAL FUNCTION PANEL
ALBUMIN: 2.3 g/dL — AB (ref 3.5–5.0)
Anion gap: 10 (ref 5–15)
BUN: 27 mg/dL — ABNORMAL HIGH (ref 8–23)
CALCIUM: 9.1 mg/dL (ref 8.9–10.3)
CO2: 22 mmol/L (ref 22–32)
CREATININE: 0.77 mg/dL (ref 0.61–1.24)
Chloride: 98 mmol/L (ref 98–111)
Glucose, Bld: 147 mg/dL — ABNORMAL HIGH (ref 70–99)
PHOSPHORUS: 3.5 mg/dL (ref 2.5–4.6)
Potassium: 5.2 mmol/L — ABNORMAL HIGH (ref 3.5–5.1)
SODIUM: 130 mmol/L — AB (ref 135–145)

## 2018-09-19 LAB — MAGNESIUM: Magnesium: 1.9 mg/dL (ref 1.7–2.4)

## 2018-09-19 MED ORDER — IOHEXOL 300 MG/ML  SOLN
100.0000 mL | Freq: Once | INTRAMUSCULAR | Status: AC
  Administered 2018-09-19: 75 mL via INTRAVENOUS

## 2018-09-19 MED ORDER — DIPHENHYDRAMINE HCL 25 MG PO CAPS: 50.0000 mg | ORAL_CAPSULE | Freq: Once | ORAL | Status: DC

## 2018-09-19 NOTE — Progress Notes (Signed)
   Subjective: He does not have any acute complaints.  He states that he is overall doing well.  Objective:  Vital signs in last 24 hours: Vitals:   09/18/18 0539 09/18/18 1430 09/18/18 2109 09/19/18 0549  BP: 120/64 119/69 114/67 117/65  Pulse: 88 95 93 78  Resp: 20 (!) 22 20 18   Temp: 98.1 F (36.7 C) 98.2 F (36.8 C) 98.7 F (37.1 C) 97.6 F (36.4 C)  TempSrc: Oral Oral Oral Oral  SpO2: 99% 98% 98% 98%  Weight:      Height:       Physical Exam  Constitutional:  Thin-appearing male lying in bed in no acute distress.  HENT:  Head: Normocephalic and atraumatic.  Dressing in place on lateral side of neck over incision site.  Eyes: EOM are normal.  Neck: Normal range of motion.  Cardiovascular: Normal rate, regular rhythm and normal heart sounds.  Pulmonary/Chest: Effort normal and breath sounds normal. No respiratory distress.  Abdominal: Soft. Bowel sounds are normal. He exhibits no distension. There is no tenderness.  Musculoskeletal: He exhibits no edema or tenderness.  Neurological: He is alert.  Skin: Skin is warm and dry.  Psychiatric: He has a normal mood and affect. His behavior is normal.  Nursing note and vitals reviewed.   Assessment/Plan:  Principal Problem:   Metastatic squamous cell carcinoma (HCC) Active Problems:   Smoking   Hypercalcemia   Pulmonary nodules/lesions, multiple   Protein-calorie malnutrition, severe   Mr.Gamachehas poorly differentiated SCCof the neck with metastasis to the lungs and bone s/p wound excision.   Abdominal/pelvic CT performed for complete staging--showed widespread lytic osseous metastatic disease and bibasilar pulmonary nodules likely metastasis.  No other signs of metastatic disease within the abdomen. p16 staining (surrogate marker for HPV) of the surgical neck tissue returned positive suggesting a primary head and neck malignancy. He is currently awaiting SNF placement and will follow up with oncology as an  outpatient.  Metastatic squamous cell carcinoma 1. Continue Dilaudid 0.5 mg every 6 hours and Percocet 5/325mg every 6 hours as needed pain 2. Follow-up with oncology post-discharge  Pathologic fracture at C2:MRI showed widespread bone metastasis throughout cervical spine with multiple associated pathologic fractures.Neurosurgery does not recommend surgery or c-collar at this time. 1. Continue PT  Anemia of chronic disease:Hb stable today at 7.3. Currently repleting Vitamin B12 and Folate.  1.  Continue vitamin B12 1000 mcg IM once daily for 5 days followed by once weekly for 4 weeks. 2.  Continue 1 mg folate once daily.  Hypophosphatemia: phos 3.5 today 1. Continue to monitor  Euvolemic Hyponatremia:Na 130 today. Differential includes malnutrition, SIADH, and volume depletion. 1. Will collect yrine sodium and urine osmolality today  Malnutrition: 1. Continue nutritional supplement  Dispo: Anticipated discharge in approximately 3-5 days pending SNF placement.  Carroll Sage, MD 09/19/2018, 7:08 AM Pager: [phone]

## 2018-09-19 NOTE — Progress Notes (Signed)
  Plastic Surgery POD#7 excision left neck lesion application Integra  CT AP completed, multiple lytic lesion pelvis noted, significant splenomegaly   Temp:  [97.6 F (36.4 C)-98.7 F (37.1 C)] 97.6 F (36.4 C) (11/21 0549) Pulse Rate:  [78-95] 78 (11/21 0549) Resp:  [18-22] 18 (11/21 0549) BP: (114-119)/(65-69) 117/65 (11/21 0549) SpO2:  [98 %] 98 % (11/21 0549)    Watching TV no complaints  PE: silicone layer intact over Integra since yesterday additional portions of this are lifting with some fluid trapped beneath. No cellulitis   A/P  Pathology with poorly differentiated SCC multiple positive margins. In setting widely metastatic disease no plan for additional excision neck skin. The deep margin included SCM muscle which was already superficially resected to remove gross tumor.   I discussed with patient that given this he will eventually experience regrowth of the tumor. Surgery to clear margins would require more radical resection, pectoralis flap closure. We discussed today that this extensive surgery he does not desire.  CT AP read as metastatic disease vs multiple myeloma.  Attempting to find SNF placement.   Ok to shower normally, soap and water ok. Dry dressing as needed over left neck. I will excise portions of the silicone layer to the fluid drain more easily later this week.   [name], MD [facility] Plastic & Reconstructive Surgery [phone], pin [phone]

## 2018-09-19 NOTE — Progress Notes (Signed)
CSW staffed case with CSW AD. Patient has been placed on the [facility] SNF waiting list. Financial Counseling following patient to see if he is eligible for Medicaid/Disability.   Percell Locus Ara [place] LCSW [phone]
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
## 2018-09-20 LAB — RENAL FUNCTION PANEL
ANION GAP: 10 (ref 5–15)
Albumin: 2.3 g/dL — ABNORMAL LOW (ref 3.5–5.0)
BUN: 41 mg/dL — AB (ref 8–23)
CHLORIDE: 98 mmol/L (ref 98–111)
CO2: 25 mmol/L (ref 22–32)
Calcium: 9 mg/dL (ref 8.9–10.3)
Creatinine, Ser: 0.7 mg/dL (ref 0.61–1.24)
GFR calc Af Amer: >60 mL/min (ref >60)
GFR calc non Af Amer: >60 mL/min (ref >60)
GLUCOSE: 141 mg/dL — AB (ref 70–99)
POTASSIUM: 4.4 mmol/L (ref 3.5–5.1)
Phosphorus: 3.3 mg/dL (ref 2.5–4.6)
Sodium: 133 mmol/L — ABNORMAL LOW (ref 135–145)

## 2018-09-20 LAB — CBC WITH DIFFERENTIAL/PLATELET
BAND NEUTROPHILS: 0 %
BASOS PCT: 0 %
Basophils Absolute: 0 10*3/uL (ref 0.0–0.1)
Blasts: 0 %
EOS ABS: 0 10*3/uL (ref 0.0–0.5)
Eosinophils Relative: 0 %
HEMATOCRIT: 22.1 % — AB (ref 39.0–52.0)
Hemoglobin: 7 g/dL — ABNORMAL LOW (ref 13.0–17.0)
Lymphocytes Relative: 13 %
Lymphs Abs: 1.7 10*3/uL (ref 0.7–4.0)
MCH: 27.7 pg (ref 26.0–34.0)
MCHC: 31.7 g/dL (ref 30.0–36.0)
MCV: 87.4 fL (ref 80.0–100.0)
MONO ABS: 1.5 10*3/uL — AB (ref 0.1–1.0)
MYELOCYTES: 0 %
Metamyelocytes Relative: 0 %
Monocytes Relative: 11 %
NEUTROS PCT: 76 %
Neutro Abs: 10.1 10*3/uL — ABNORMAL HIGH (ref 1.7–7.7)
Other: 0 %
PROMYELOCYTES RELATIVE: 0 %
Platelets: 141 10*3/uL — ABNORMAL LOW (ref 150–400)
RBC: 2.53 MIL/uL — AB (ref 4.22–5.81)
RDW: 17 % — AB (ref 11.5–15.5)
WBC Morphology: INCREASED
WBC: 13.3 10*3/uL — AB (ref 4.0–10.5)
nRBC: 0 /100 WBC
nRBC: 1.4 % — ABNORMAL HIGH (ref 0.0–0.2)

## 2018-09-20 NOTE — Progress Notes (Signed)
  Plastic Surgery POD# 8 excision left neck lesion application Integra   Temp:  [97.8 F (36.6 C)-98.2 F (36.8 C)] 97.8 F (36.6 C) (11/22 0508) Pulse Rate:  [77-94] 80 (11/22 1518) Resp:  [17-18] 18 (11/22 1518) BP: (117-131)/(65-68) 131/66 (11/22 1518) SpO2:  [97 %-100 %] 100 % (11/22 1518)   Wished me a Happy Thanksgiving.   PE: silicone layer intact over Integra lifting with some fluid draining. No cellulitis   A/P  Removed silicone sheeting, thick fluid beneath represented unincorporated Integra. Ok to continue to shower normally, soap and water ok. May do moist to dry or hydrogel over area and dry dressing.   Attempting to find SNF placement.   [name], MD [facility] Plastic & Reconstructive Surgery [phone], pin [phone]

## 2018-09-20 NOTE — Plan of Care (Signed)

## 2018-09-20 NOTE — Progress Notes (Signed)
Palliative Medicine consult noted. Due to high referral volume, there may be a delay seeing this patient. Please call the Palliative Medicine Team office at [phone] if recommendations are needed in the interim.  Thank you for inviting [place] to see this patient.  Marjie Skiff Demarrion Meiklejohn, RN, BSN, [facility] Palliative Medicine Team 09/20/2018 1:45 PM Office [phone]

## 2018-09-20 NOTE — Progress Notes (Signed)
   Subjective: Austin Payne reports no acute complaints. He states that he is doing the same as yesterday.  Objective:  Vital signs in last 24 hours: Vitals:   09/19/18 0549 09/19/18 1752 09/19/18 2035 09/20/18 0508  BP: 117/65 119/65 120/67 117/68  Pulse: 78 94 86 77  Resp: 18 18 18 17   Temp: 97.6 F (36.4 C) 97.9 F (36.6 C) 98.2 F (36.8 C) 97.8 F (36.6 C)  TempSrc: Oral Oral Oral Oral  SpO2: 98% 97% 98% 98%  Weight:      Height:       Physical Exam  Constitutional: He is oriented to person, place, and time. He appears well-developed and well-nourished.  HENT:  Head: Normocephalic and atraumatic.  Bandage on left side of neck at surgical site.  Eyes: EOM are normal.  Neck: Normal range of motion.  Cardiovascular: Normal rate, regular rhythm and normal heart sounds.  Pulmonary/Chest: Effort normal and breath sounds normal.  Abdominal: Soft. Bowel sounds are normal. He exhibits no distension. There is no tenderness.  Musculoskeletal: Normal range of motion. He exhibits no edema or tenderness.  Neurological: He is alert and oriented to person, place, and time.  Skin: Skin is warm and dry.  Psychiatric: He has a normal mood and affect. His behavior is normal.  Nursing note and vitals reviewed.   Assessment/Plan:  Principal Problem:   Metastatic squamous cell carcinoma (HCC) Active Problems:   Smoking   Hypercalcemia   Pulmonary nodules/lesions, multiple   Protein-calorie malnutrition, severe  [name] poorly differentiatedSCCof the neck with metastasisto the lungs and bone s/p wound excision.He is currently awaiting SNF placement and we will consult palliative care today to establish goals of care.  He will follow-up with oncology as an outpatient.  Metastatic squamous cell carcinoma 1.Continue Dilaudid 0.5 mg every 6 hours and Percocet 5/325mg every 6 hours as needed pain 2. Follow-up palliative care recs  Pathologic fracture at C2:MRI showed  widespread bone metastasis throughout cervical spine with multiple associated pathologic fractures.Neurosurgery does not recommend surgery or c-collar at this time. 1. Continue PT  Anemia of chronic disease:Hb stable today at 7.0. Currently repleting Vitamin B12 and Folate.  1.Continue vitamin B12 1000 mcg IM once daily for 5 days followed by once weekly for 4 weeks. 2. Continue 1 mg folate once daily. 3.  Will recheck CBC in 2-3 days.  Hypophosphatemia: Resolved 1. Continue to monitor  Euvolemic Hyponatremia:Na 133today.  Likely secondary to malnutrition. 1. We will recheck BMP in 2 to 3 days.  Malnutrition: 1. Continue nutritional supplement  Dispo: Anticipated discharge in approximately 3-5 days pending SNF placement.  Carroll Sage, MD 09/20/2018, 10:26 AM Pager: [phone]

## 2018-09-21 DIAGNOSIS — K5900 Constipation, unspecified: Secondary | ICD-10-CM

## 2018-09-21 MED ORDER — FLEET ENEMA 7-19 GM/118ML RE ENEM
1.0000 | ENEMA | Freq: Once | RECTAL | Status: DC
  Filled 2018-09-21: qty 1

## 2018-09-21 MED ORDER — NICOTINE 21 MG/24HR TD PT24
21.0000 mg | MEDICATED_PATCH | Freq: Every day | TRANSDERMAL | Status: DC
  Administered 2018-09-21 – 2018-10-17 (×27): 21 mg via TRANSDERMAL
  Filled 2018-09-21 (×28): qty 1

## 2018-09-21 MED ORDER — OXYCODONE HCL 5 MG PO TABS
5.0000 mg | ORAL_TABLET | ORAL | Status: DC | PRN
  Administered 2018-09-22 – 2018-09-24 (×7): 5 mg via ORAL
  Filled 2018-09-21 (×7): qty 1

## 2018-09-21 MED ORDER — HYDROMORPHONE HCL 1 MG/ML IJ SOLN: 0.5000 mg | INTRAMUSCULAR | Status: DC | PRN

## 2018-09-21 MED ORDER — SENNA 8.6 MG PO TABS
2.0000 | ORAL_TABLET | Freq: Every day | ORAL | Status: DC
  Administered 2018-09-21 – 2018-10-15 (×23): 17.2 mg via ORAL
  Filled 2018-09-21 (×27): qty 2

## 2018-09-21 NOTE — Progress Notes (Signed)
  Plastic Surgery POD# 9 excision left neck lesion application Integra   Temp:  [97.6 F (36.4 C)-97.7 F (36.5 C)] 97.7 F (36.5 C) (11/23 0518) Pulse Rate:  [88-93] 93 (11/23 0833) Resp:  [18] 18 (11/23 0518) BP: (114-119)/(66-71) 114/71 (11/23 0833) SpO2:  [98 %] 98 % (11/23 0518)    PE: Dressing placed in pm 11.22 removed with adherence guaze moderate drainage, base is appr 80% granulated at this time  A/P  Applied hydrogel wound and covered with dry dressing. Ok to continue to shower normally, soap and water ok.   [name], MD [facility] Plastic & Reconstructive Surgery [phone], pin [phone]

## 2018-09-21 NOTE — Progress Notes (Signed)
   09/21/18 1833  Clinical Encounter Type  Visited With Patient  Visit Type Initial  Referral From Palliative care team  Consult/Referral To Chaplain  The chaplain responded to Pt. request for prayer from PMT. The chaplain was welcomed into the room by the Pt.  The Pt. 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  The message the Pt. communicated through out my pastoral presence was his desire for [name].  The Pt. has a vision of a continued care community where he can grow stronger and will no longer be lonely. The chaplain and Pt. prayed together recognizing the Pt. hope and goals in his story. The chaplain returned with a Bible per the Pt. request.  The chaplain will follow up with the PMT for input on F/U spiritual care.

## 2018-09-21 NOTE — Progress Notes (Signed)
   Subjective: Patient is in a significant amount of pain this AM. His back is hurting and he is uncomfortable. He has not gotten any pain medication this AM. When he gets his pain medication his pain is well controlled and he is able to ambulate. Did ambulate yesterday. Tolerating PO intake. We are awaiting SNF placement. All questions and concerns addressed.   Objective: Vital signs in last 24 hours: Vitals:   09/20/18 0508 09/20/18 1518 09/20/18 2122 09/21/18 0518  BP: 117/68 131/66 119/66 119/70  Pulse: 77 80 92 88  Resp: 17 18 18 18   Temp: 97.8 F (36.6 C)  97.6 F (36.4 C) 97.7 F (36.5 C)  TempSrc: Oral  Oral Oral  SpO2: 98% 100% 98% 98%  Weight:      Height:       General: Thin male in no acute distress HENT: Normocephalic, atraumatic, moist mucus membranes Pulm: Good air movement with no wheezing or crackles  CV: RRR, no murmurs, no rubs   Assessment/Plan:  Mr.Gamachehas poorly differentiatedSCCof the neck with metastasisto the lungs and bone s/p wound excision.He is currently awaiting SNF placement and we will consult palliative care today to establish goals of care.  He will follow-up with oncology as an outpatient.  Metastatic squamous cell carcinoma 1.Start Dilaudid 0.5 mg every 3 hours PRN and Oxy IR 5 mg every 4 hours PRN. We will control his pain and possibly switch to long acting medication for pain control on discharge pending frequency of need.  2. Follow-up palliative care recs  Pathologic fracture at C2:MRI showed widespread bone metastasis throughout cervical spine with multiple associated pathologic fractures.Neurosurgery does not recommend surgery or c-collar at this time. 1. Continue PT  Anemia of chronic disease:Hb stable today at 7.0. Currently repleting Vitamin B12 and Folate. 1.Continuevitamin B12 1000 mcg IM once daily for 5 days followed by once weekly for 4 weeks. 2.Continue1 mg folate once daily. 3.  Will recheck CBC in 2-3  days.  Hypophosphatemia: Resolved 1.Continue to monitor  Euvolemic Hyponatremia:Na 133today. Likely secondary to malnutrition. 1. We will recheck BMP in 2 to 3 days.  Malnutrition: 1. Continue nutritional supplement  Dispo: Medically stable for discharge. Awaiting SNF placement.   Ina Homes, MD 09/21/2018, 8:05 AM

## 2018-09-21 NOTE — Consult Note (Addendum)
Consultation Note Date: 09/21/2018   Patient Name: Austin Payne  DOB: 11-Sep-1956  MRN: [id number]  Age / Sex: 62 y.o., male  PCP: Patient, No Pcp Per Referring Physician: Bartholomew Crews, MD  Reason for Consultation: Establishing goals of care, Pain control and Psychosocial/spiritual support  HPI/Patient Profile: 62 y.o. male  with past medical history of neck mass, NSTEMI s/p stent, tobacco use  who was admitted on 09/06/2018 with hypercalcemia (14+) and altered mental status. Per Epic notes he was living in a hotel and was in poor condition when he was found as evidenced by bed bugs, roaches and maggots in his wound. 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  His neck mass has been excised.  Pathology indicates that there is invasion into the inked margins, the lymph and perineural areas.   Clinical Assessment and Goals of Care:  I have reviewed medical records including EPIC notes, labs and imaging, received report from the care team, assessed the patient and then met at the bedside and discussed diagnosis prognosis, GOC, EOL wishes, disposition and options.  I introduced Palliative Medicine as specialized medical care for people living with serious illness. It focuses on providing relief from the symptoms and stress of a serious illness. The goal is to improve quality of life for both the patient and the family.  We discussed a brief life review of the patient. He was in the [employer] for 10 years.  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  He has no family.  He had a girlfriend in the past but does not know where she is any longer.    He has no interest in using the [place] for medical care or resources.  We talked about his health.  He was able to tell me that the cancer from his neck has spread to his back and lungs.  He wants to pursue Oncology  treatment, but knows it will not be curative.  He is focused on eating more and getting up out of bed.  He described his pain has being in his back and legs.  It is constant.  He tells me it feels better when he is up and walking.  He does not like being down in the bed.  I attempted to elicit values and goals of care important to the patient.  He wants to pursue treatment and build himself up.  However, if he declines to the point where he arrests he does not want resuscitation or life support.  Primary Decision Maker:  PATIENT    SUMMARY OF RECOMMENDATIONS     I am concerned that he has no one to speak for him if he is unable to speak for himself.  I'm concerned that he has no social support.  I would encourage him to engage with the [facility] for medical care as well as support (housing?)  Code Status has been changed to DNR with full scope treatment.  He wants to pursue  palliative chemo and radiation.  On imaging there is a large stool ball in his rectum.  If this is still present it needs to be cleaned out as it likely contributes to his pain.  Med history reads as though he is not receiving any regularly scheduled pain medication.  Scheduled tylenol may be helpful.  Nicotine patch ordered  Ambulate.   Code Status/Advance Care Planning:  DNR   Psycho-social/Spiritual:   Desire for further Chaplaincy support: yes  Prognosis:  Unable to determine    Discharge Planning: [facility] for rehab with Palliative care service follow-up      Primary Diagnoses: Present on Admission: . Hypercalcemia . Pulmonary nodules/lesions, multiple . Protein-calorie malnutrition, severe . Smoking   I have reviewed the medical record, interviewed the patient and family, and examined the patient. The following aspects are pertinent.  Past Medical History:  Diagnosis Date  . Hyperlipidemia   . Hypertension   . Myocardial infarction ([place])    04/23/17 PCI with DESx1 LCx  EF 50%  . Neck mass   . NSTEMI (non-ST elevated myocardial infarction) ([place])   . SEMI (subendocardial myocardial infarction) ([place]) 04/21/2017  . Smoking 04/21/2017  . Tobacco use    Social History   Socioeconomic History  . Marital status: Single    Spouse name: Not on file  . Number of children: Not on file  . Years of education: Not on file  . Highest education level: Not on file  Occupational History  . Not on file  Social Needs  . Financial resource strain: Not on file  . Food insecurity:    Worry: Not on file    Inability: Not on file  . Transportation needs:    Medical: Not on file    Non-medical: Not on file  Tobacco Use  . Smoking status: Never Smoker  . Smokeless tobacco: Never Used  Substance and Sexual Activity  . Alcohol use: No  . Drug use: No  . Sexual activity: Not on file  Lifestyle  . Physical activity:    Days per week: Not on file    Minutes per session: Not on file  . Stress: Not on file  Relationships  . Social connections:    Talks on phone: Not on file    Gets together: Not on file    Attends religious service: Not on file    Active member of club or organization: Not on file    Attends meetings of clubs or organizations: Not on file    Relationship status: Not on file  Other Topics Concern  . Not on file  Social History Narrative  . Not on file   History reviewed. No pertinent family history. Scheduled Meds: . aspirin  81 mg Oral Daily  . cyanocobalamin  1,000 mcg Intramuscular Daily  . feeding supplement (ENSURE ENLIVE)  237 mL Oral TID BM  . folic acid  1 mg Intravenous Daily  . heparin  5,000 Units Subcutaneous Q8H  . metoprolol tartrate  12.5 mg Oral BID  . multivitamin with minerals  1 tablet Oral Daily  . senna  2 tablet Oral Daily  . sodium phosphate  1 enema Rectal Once   Continuous Infusions: PRN Meds:.acetaminophen, cetaphil, HYDROmorphone (DILAUDID) injection, ipratropium-albuterol, oxyCODONE Allergies  Allergen  Reactions  . Contrast Media [Iodinated Diagnostic Agents] Other (See Comments)    Unknown.   Review of Systems complains of back and leg pain.  Denies dysphagia, SOB, insomnia  Physical Exam  Well developed  cachectically thin, frail man with poor dentition.  Awake, Alert, pleasant CV rrr Resp no distress  Vital Signs: BP 114/71   Pulse 93   Temp 97.7 F (36.5 C) (Oral)   Resp 18   Ht '6\' 2"'$  (1.88 m)   Wt 55.6 kg   SpO2 98%   BMI 15.74 kg/m  Pain Scale: 0-10 POSS *See Group Information*: 1-Acceptable,Awake and alert Pain Score: 10-Worst pain ever   SpO2: SpO2: 98 % O2 Device:SpO2: 98 % O2 Flow Rate: .O2 Flow Rate (L/min): 3 L/min  IO: Intake/output summary:   Intake/Output Summary (Last 24 hours) at 09/21/2018 1021 Last data filed at 09/21/2018 0539 Gross per 24 hour  Intake 120 ml  Output 1400 ml  Net -1280 ml    LBM: Last BM Date: 09/18/18 Baseline Weight: Weight: 66.7 kg Most recent weight: Weight: 55.6 kg     Palliative Assessment/Data: 40%     Time In: 10:00 Time Out: 11:10 Time Total: 70 min. Greater than 50%  of this time was spent counseling and coordinating care related to the above assessment and plan.  Signed by: Florentina Jenny, PA-C Palliative Medicine Pager: [phone]  Please contact Palliative Medicine Team phone at [phone] for questions and concerns.  For individual provider: See Shea Evans

## 2018-09-22 NOTE — Progress Notes (Signed)
   Subjective: Austin Payne reports no acute complaints.  He denies any abdominal pain, chest pain, shortness of breath.  He asked why his SNF placement is taking so long.  I explained to him that since he does not have insurance will take much longer to find an accepting SNF.  He expresses understanding and agreement.  Objective:  Vital signs in last 24 hours: Vitals:   09/21/18 0833 09/21/18 1707 09/21/18 2206 09/22/18 0458  BP: 114/71 137/62 128/68 132/86  Pulse: 93 (!) 105 (!) 104 (!) 105  Resp:  18    Temp:  97.7 F (36.5 C) 97.9 F (36.6 C) 98.7 F (37.1 C)  TempSrc:  Oral  Oral  SpO2:  98% 100% 98%  Weight:      Height:       Physical Exam  Constitutional: He is oriented to person, place, and time. He appears well-developed and well-nourished.  HENT:  Head: Normocephalic and atraumatic.  Eyes: EOM are normal.  Neck: Normal range of motion.  Bandage in place over left sided neck postsurgical wound.  Cardiovascular: Normal rate and regular rhythm.  Pulmonary/Chest: Effort normal and breath sounds normal. No respiratory distress.  Abdominal: Soft. Bowel sounds are normal.  Musculoskeletal: Normal range of motion. He exhibits no edema or tenderness.  Neurological: He is alert and oriented to person, place, and time.  Skin: Skin is warm and dry.  Psychiatric: He has a normal mood and affect. His behavior is normal.  Nursing note and vitals reviewed.   Assessment/Plan:  Principal Problem:   Metastatic squamous cell carcinoma (HCC) Active Problems:   Smoking   Hypercalcemia   Pulmonary nodules/lesions, multiple   Protein-calorie malnutrition, severe   Constipation  [name] poorly differentiatedSCCof the neck with metastasisto the lungs and bone s/p wound excision.He is currently awaiting SNF placement.  Palliative care consulted and patient's wishes include palliative chemo and radiation.  His CODE STATUS was changed to DNR with full scope treatment. He was  started on Dilaudid and oxycodone as needed pain.  He only received 5 mg of oxycodone yesterday.  I reiterated to him this morning that he has several types of pain medications PRN and he can ask for these at any time.  He expressed understanding and agreement.  Metastatic squamous cell carcinoma 1.Continue Dilaudid 0.5 mg every 3 hours PRN and Oxy IR 5 mg every 4 hours PRN.  Pathologic fracture at C2:MRI showed widespread bone metastasis throughout cervical spine with multiple associated pathologic fractures.Neurosurgery does not recommend surgery or c-collar at this time. 1. Continue PT  Anemia of chronic disease:Hb stable today at 7.0 (11/22). Currently repleting Vitamin B12 and Folate. Will recheck CBC tomorrow morning. 1.Continuevitamin B12 1000 mcg IM once daily for 5 days followed by once weekly for 4 weeks. 2.Continue1 mg folate once daily.   Euvolemic Hyponatremia:Na 133(11/22).Likely secondary tomalnutrition.  We will recheck his BMP tomorrow morning. 1.Continue to monitor  Malnutrition: 1. Continue nutritional supplement  Dispo: Medically stable for discharge. Awaiting SNF placement.   Carroll Sage, MD 09/22/2018, 7:06 AM Pager: [phone]

## 2018-09-23 DIAGNOSIS — D638 Anemia in other chronic diseases classified elsewhere: Secondary | ICD-10-CM | POA: Diagnosis present

## 2018-09-23 LAB — CBC WITH DIFFERENTIAL/PLATELET
Band Neutrophils: 7 %
Basophils Absolute: 0 10*3/uL (ref 0.0–0.1)
Basophils Relative: 0 %
EOS PCT: 0 %
Eosinophils Absolute: 0 10*3/uL (ref 0.0–0.5)
HCT: 19.4 % — ABNORMAL LOW (ref 39.0–52.0)
Hemoglobin: 6.1 g/dL — CL (ref 13.0–17.0)
LYMPHS PCT: 13 %
Lymphs Abs: 1.5 10*3/uL (ref 0.7–4.0)
MCH: 27.5 pg (ref 26.0–34.0)
MCHC: 31.4 g/dL (ref 30.0–36.0)
MCV: 87.4 fL (ref 80.0–100.0)
METAMYELOCYTES PCT: 10 %
Monocytes Absolute: 0.9 10*3/uL (ref 0.1–1.0)
Monocytes Relative: 8 %
Myelocytes: 7 %
NEUTROS ABS: 7 10*3/uL (ref 1.7–7.7)
NRBC: 2 /100{WBCs} — AB
Neutrophils Relative %: 52 %
Platelets: 137 10*3/uL — ABNORMAL LOW (ref 150–400)
Promyelocytes Relative: 3 %
RBC: 2.22 MIL/uL — ABNORMAL LOW (ref 4.22–5.81)
RDW: 17.4 % — ABNORMAL HIGH (ref 11.5–15.5)
WBC: 11.8 10*3/uL — AB (ref 4.0–10.5)
nRBC: 1.6 % — ABNORMAL HIGH (ref 0.0–0.2)

## 2018-09-23 LAB — MAGNESIUM: Magnesium: 1.9 mg/dL (ref 1.7–2.4)

## 2018-09-23 LAB — BASIC METABOLIC PANEL
ANION GAP: 9 (ref 5–15)
BUN: 30 mg/dL — ABNORMAL HIGH (ref 8–23)
CALCIUM: 8.9 mg/dL (ref 8.9–10.3)
CO2: 25 mmol/L (ref 22–32)
Chloride: 97 mmol/L — ABNORMAL LOW (ref 98–111)
Creatinine, Ser: 0.74 mg/dL (ref 0.61–1.24)
GFR calc Af Amer: >60 mL/min (ref >60)
GFR calc non Af Amer: >60 mL/min (ref >60)
GLUCOSE: 108 mg/dL — AB (ref 70–99)
POTASSIUM: 4.6 mmol/L (ref 3.5–5.1)
SODIUM: 131 mmol/L — AB (ref 135–145)

## 2018-09-23 LAB — PHOSPHORUS: PHOSPHORUS: 3.2 mg/dL (ref 2.5–4.6)

## 2018-09-23 LAB — PREPARE RBC (CROSSMATCH)

## 2018-09-23 MED ORDER — SODIUM CHLORIDE 0.9% IV SOLUTION
Freq: Once | INTRAVENOUS | Status: AC
  Administered 2018-09-23: 15:00:00 via INTRAVENOUS

## 2018-09-23 NOTE — Progress Notes (Signed)
  Plastic Surgery POD# 11 excision left neck lesion application Integra  Awaiting placment Hb down to 6.1 today, per patient awaiting transfusion.  Temp:  [98.2 F (36.8 C)-98.5 F (36.9 C)] 98.2 F (36.8 C) (11/25 0550) Pulse Rate:  [93-111] 93 (11/25 0550) Resp:  [18] 18 (11/25 0550) BP: (106-126)/(65-71) 110/65 (11/25 1102) SpO2:  [96 %-99 %] 96 % (11/25 0550)    PE: Wound clean small drainage, increasing granulation  A/P  Continue hydrogel to wound and cover with dry dressing. Ok to continue to shower normally, soap and water ok.   [name], MD [facility] Plastic & Reconstructive Surgery [phone], pin [phone]

## 2018-09-23 NOTE — Progress Notes (Signed)
PT Cancellation Note  Patient Details Name: Austin Payne MRN: [id number] DOB: 1956-02-12   Cancelled Treatment:    Reason Eval/Treat Not Completed: Patient declined, no reason specified.  Pt getting a unit of blood.  Pt wanted to wait until later.  Unable to return. 09/23/2018  Donnella Sham, [place] [phone]  (pager) [phone]  (office)   [name] 09/23/2018, 6:32 PM

## 2018-09-23 NOTE — Progress Notes (Signed)
Patient remains on Difficult to Place SNF list.   Cedric Fishman LCSW [phone]

## 2018-09-23 NOTE — Progress Notes (Signed)
   Subjective: Mr. Marcello Moores states that he is in more pain this morning describing it mostly in his lower extremities bilaterally and back.  He does state that his PRN pain medications are helping.  He denies shortness of breath, chest pain, abdominal pain.  He reports no other acute complaints.  Objective:  Vital signs in last 24 hours: Vitals:   09/22/18 1430 09/22/18 2125 09/23/18 0550 09/23/18 1102  BP: 126/65 125/71 106/66 110/65  Pulse: (!) 105 (!) 111 93   Resp: 18 18 18    Temp: 98.5 F (36.9 C) 98.2 F (36.8 C) 98.2 F (36.8 C)   TempSrc:  Oral    SpO2: 99% 97% 96%   Weight:      Height:       Physical Exam  Constitutional:  Ill-appearing male lying in bed in no acute distress.  HENT:  Head: Normocephalic and atraumatic.  Bandage on left side of neck in place.  Eyes: EOM are normal.  Neck: Normal range of motion.  Cardiovascular: Normal rate, regular rhythm and normal heart sounds.  Pulmonary/Chest: Effort normal and breath sounds normal. No respiratory distress.  Abdominal: Soft. Bowel sounds are normal. He exhibits no distension. There is no tenderness.  Musculoskeletal: He exhibits no edema or tenderness.  Neurological: He is alert.  Skin: Skin is warm and dry.  Psychiatric: He has a normal mood and affect. His behavior is normal.  Nursing note and vitals reviewed.   Assessment/Plan:  Principal Problem:   Metastatic squamous cell carcinoma (HCC) Active Problems:   Smoking   Hypercalcemia   Pulmonary nodules/lesions, multiple   Protein-calorie malnutrition, severe   Constipation  Mr.Gamachehas poorly differentiatedSCCof the neck with metastasisto the lungs and bone s/p wound excision.He is currently awaiting SNF placement.   This morning he was found to have a  normocytic anemia (Hb 6.1) despite treatment with folate and B12.  He will be transfused 2 units of  pRBC today.  Metastatic squamous cell carcinoma 1.Continue Dilaudid 0.5 mg every 3 hours  PRN and Oxy IR 5 mg every 4 hours PRN.  Pathologic fracture at C2:MRI showed widespread bone metastasis throughout cervical spine with multiple associated pathologic fractures.Neurosurgery does not recommend surgery or c-collar at this time. 1. Continue PT  Normocytic anemia:Hb downtrending (6.1) despite Vitamin B12 and Folate replacement. Unclear etiology at this point. Will transfuse him with 2 units of pRBC and recheck CBC post-transfusion. 1.Ordered 2 units pRBC 2. Follow-up post-transfusion CBC 3. Continuevitamin B12 1000 mcg IM once daily for 5 days followed by once weekly for 4 weeks. 4.Continue1 mg folate once daily.  Euvolemic Hyponatremia:Stable Na 131 today.Likely secondary tomalnutrition. 1.Continue to monitor  Malnutrition: 1. Continue nutritional supplement  Lower extremity pain: Intermittent and previously resolved with electrolyte/mineral replacement. Will recheck phos and mag this morning. 1. Follow-up phosphorus and magnesium  Dispo: Anticipated discharge when SNF placement opens up.  Carroll Sage, MD 09/23/2018, 11:58 AM Pager: [phone]

## 2018-09-23 NOTE — Progress Notes (Signed)
   09/23/18 1124  Clinical Encounter Type  Visited With Patient  Visit Type Follow-up;Spiritual support  Referral From Palliative care team  Consult/Referral To Chaplain  The chaplain followed up with Pt. spiritual care by bringing the Pt. a pair of reading glasses. The Pt. was very appreciative and planning to use the glasses to read scripture.  The Pt. shared Monday morning was filled with medical care.  The Pt. associated his pain level with the care he is receiving.  The Pt. prayer requests included his healthcare providers.  The chaplain will follow up with the Pt.

## 2018-09-23 NOTE — Progress Notes (Signed)
Nutrition Follow-up  DOCUMENTATION CODES:   Severe malnutrition in context of social or environmental circumstances  INTERVENTION:   - Obtain new weight as last recorded weight is from 11/8 (placed verbal with readback order per discussion with MD)  - Continue Ensure Enlive po TID, each supplement provides 350 kcal and 20 grams of protein  - Continue MVI with minerals daily  - Snacks BID between meals (cereal, whole milk, and fruit per pt request)  NUTRITION DIAGNOSIS:   Severe Malnutrition related to social / environmental circumstances (homelessness) as evidenced by percent weight loss, severe fat depletion, severe muscle depletion.  Ongoing  GOAL:   Patient will meet greater than or equal to 90% of their needs  Progressing  MONITOR:   PO intake, Supplement acceptance, Skin, Labs, Weight trends  REASON FOR ASSESSMENT:   Consult, Other (Low BMI) Assessment of nutrition requirement/status  ASSESSMENT:   Austin Payne is a 62 yo male with PMH of MI, HTN, HLD, tobacco use, neck mass, NSTEMI, CAD s/p PCI 2018 admitted with hypercalciemia, multiple pulmonary nodules, and likely metastatic squamous cell cancer of the skin of left neck. Pt has 30-month history of left-sided neck lesion which is nonhealing; pt was found in hotel room with neck wound covered with maggots.   11/15 - s/p excision of malignant left neck lesion (squamous cell carcinoma w/ multiple mets including lung nodules and lytic bone lesions) 11/19 - oncology consulted 11/20 - CT of abdomen/pelvis showed widespread lytic osseous metastatic disease and bibasilar pulmonary nodules, p16 staining returned positive which suggests a primary head and neck malignancy  Pt continues to await SNF placement, on difficult to place list. Palliative care team following; pt's wishes include palliative chemo and radiation.  Spoke with pt at bedside. RN also at bedside providing nursing care.  Pt endorses drinking 2-3 Ensure  Enlive oral nutrition supplements daily. Pt states that he likes the Ensure [place] and does not have a flavor preference.  Pt reports that he is not eating well because he does not like the food. Encouraged pt to order whatever looks good to him from the menu as he is on a Regular diet with no restrictions. Noted untouched breakfast meal tray at bedside. Per RN, pt ordered a second breakfast meal tray to include cereal and fruit.  Pt requested cereal and fruit BID for snacks. RD ordered via [place]. Encouraged PO intake and continued consumption of Ensure Enlive.  Meal Completion: 0-100% x last 8 recorded meals (extremely varied)  Medications reviewed and include: Ensure Enlive TID (pt accepting >90% of times offered), MVI with minerals daily, Senokot  Labs reviewed: sodium 131 (L), BUN 30 (H), hemoglobin 6.1 (L)  Diet Order:   Diet Order            Diet regular Room service appropriate? Yes; Fluid consistency: Thin  Diet effective now              EDUCATION NEEDS:   No education needs have been identified at this time  Skin:  Skin Assessment: Skin Integrity Issues: Other: malodorous neck wound  Last BM:  11/24 (medium type 6)  Height:   Ht Readings from Last 1 Encounters:  09/06/18 6\' 2"  (1.88 m)    Weight:   Wt Readings from Last 1 Encounters:  09/06/18 55.6 kg    Ideal Body Weight:  86.4 kg  BMI:  Body mass index is 15.74 kg/m.  Estimated Nutritional Needs:   Kcal:  2100-2300  Protein:  105-115 gm  Fluid:  >/=2.1 L    Austin Face, MS, RD, LDN Inpatient Clinical Dietitian Pager: [phone] Weekend/After Hours: [phone]

## 2018-09-23 NOTE — Progress Notes (Signed)
   09/23/18 1142  Clinical Encounter Type  Visited With Health care provider  The chaplain shared with the Pt. RN-Sam, the gift of a pencil for Pt. journaling in his Bible.  The RN accepted the chaplain's statement.

## 2018-09-24 LAB — BPAM RBC
BLOOD PRODUCT EXPIRATION DATE: 201912042359
BLOOD PRODUCT EXPIRATION DATE: 201912042359
ISSUE DATE / TIME: 201911251456
ISSUE DATE / TIME: 201911251845
UNIT TYPE AND RH: 1700
UNIT TYPE AND RH: 1700

## 2018-09-24 LAB — CBC WITH DIFFERENTIAL/PLATELET
Abs Immature Granulocytes: 2.43 10*3/uL — ABNORMAL HIGH (ref 0.00–0.07)
BASOS PCT: 0 %
Basophils Absolute: 0 10*3/uL (ref 0.0–0.1)
EOS ABS: 0.1 10*3/uL (ref 0.0–0.5)
Eosinophils Relative: 1 %
HCT: 27.7 % — ABNORMAL LOW (ref 39.0–52.0)
Hemoglobin: 9.1 g/dL — ABNORMAL LOW (ref 13.0–17.0)
Immature Granulocytes: 18 %
LYMPHS ABS: 2 10*3/uL (ref 0.7–4.0)
Lymphocytes Relative: 14 %
MCH: 27.7 pg (ref 26.0–34.0)
MCHC: 32.9 g/dL (ref 30.0–36.0)
MCV: 84.2 fL (ref 80.0–100.0)
Monocytes Absolute: 1.5 10*3/uL — ABNORMAL HIGH (ref 0.1–1.0)
Monocytes Relative: 11 %
NEUTROS PCT: 56 %
NRBC: 1.9 % — AB (ref 0.0–0.2)
Neutro Abs: 7.5 10*3/uL (ref 1.7–7.7)
PLATELETS: 137 10*3/uL — AB (ref 150–400)
RBC: 3.29 MIL/uL — ABNORMAL LOW (ref 4.22–5.81)
RDW: 16 % — ABNORMAL HIGH (ref 11.5–15.5)
WBC: 13.6 10*3/uL — ABNORMAL HIGH (ref 4.0–10.5)

## 2018-09-24 LAB — TYPE AND SCREEN
ABO/RH(D): B NEG
ANTIBODY SCREEN: NEGATIVE
Unit division: 0
Unit division: 0

## 2018-09-24 MED ORDER — POLYETHYLENE GLYCOL 3350 17 G PO PACK
17.0000 g | PACK | Freq: Every day | ORAL | Status: DC
  Administered 2018-09-30 – 2018-10-17 (×9): 17 g via ORAL
  Filled 2018-09-24 (×23): qty 1

## 2018-09-24 MED ORDER — TAMSULOSIN HCL 0.4 MG PO CAPS
0.4000 mg | ORAL_CAPSULE | Freq: Every day | ORAL | Status: DC
  Administered 2018-09-24 – 2018-10-17 (×24): 0.4 mg via ORAL
  Filled 2018-09-24 (×25): qty 1

## 2018-09-24 MED ORDER — FOLIC ACID 1 MG PO TABS
1.0000 mg | ORAL_TABLET | Freq: Every day | ORAL | Status: DC
  Administered 2018-09-24 – 2018-10-17 (×24): 1 mg via ORAL
  Filled 2018-09-24 (×25): qty 1

## 2018-09-24 MED ORDER — CYANOCOBALAMIN 1000 MCG/ML IJ SOLN
1000.0000 ug | INTRAMUSCULAR | Status: DC
  Administered 2018-09-29 – 2018-10-13 (×3): 1000 ug via INTRAMUSCULAR
  Filled 2018-09-24 (×3): qty 1

## 2018-09-24 NOTE — Progress Notes (Addendum)
Two attempts were made for in and out cath  Unsuccessful.  Notified the MD.  Will continue to monitor patient.

## 2018-09-24 NOTE — Progress Notes (Signed)
Paged that patient was declining urinary catheter placement. Presented to bedside to discuss with patient. He does not want the catheter placed. He understand the risks of acute urinary retention including bladder rupture which could result in death. Discussed that if he changes his mind to let us know.   Thoughts are that his acute urinary retention are 2/2 opiate side effects. Anecdotal evidence that tamsulosin 0.4 mg QD will help. Start therapy.

## 2018-09-24 NOTE — Progress Notes (Signed)
Patient called with complaints of abdomen pain.  Abdomen is very distended.  Bladder scanned the patient and 882 mL found.   Patient keeps the urinal between his legs due to urgency.  Urine in the urinal has a foul odor.   Notified MD.  Will continue to monitor the patient
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
## 2018-09-24 NOTE — Progress Notes (Signed)
Per Dr. Laural Golden on call, Thayer Jew, RN made an attempt to insert foley catheter. Thayer Jew was unsuccessful.  Notified Dr.  Laural Golden .  MD stated she will notify day shift MD. Will continue to monitor the patient.

## 2018-09-24 NOTE — Progress Notes (Signed)
Bladder scanned showed 912 ml of urine in pt. I as well as charge nurse explained to patient importance of receiving foley and complications that could occur if not put in. Pt refused multiple times stating he does not want it. Doctor notified, awaiting further instructions.
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
## 2018-09-24 NOTE — Progress Notes (Signed)
   Subjective: Patient is in a significant amount of pain. Continues to have abdominal pain and is unable to void. Nursing tries several in and out catheters without success. Describe meeting resistant.   Objective: Vital signs in last 24 hours: Vitals:   09/23/18 1836 09/23/18 1904 09/23/18 2158 09/24/18 0544  BP: 120/68 117/70 (!) 143/94 136/90  Pulse: (!) 101 100 (!) 101 96  Resp: 16 18 18 18   Temp: 98.5 F (36.9 C) 98.4 F (36.9 C) 98.1 F (36.7 C) 97.6 F (36.4 C)  TempSrc: Oral  Oral   SpO2: 97%  96% 98%  Weight:      Height:       Physical Exam  Constitutional: He appears well-developed and well-nourished.  HENT:  Head: Normocephalic and atraumatic.  Bandage in place on left-side of neck at post-op surgical site.   Eyes: EOM are normal.  Neck: Normal range of motion.  Cardiovascular: Normal rate and regular rhythm.  Pulmonary/Chest: Effort normal and breath sounds normal.  Abdominal: Soft. Bowel sounds are normal.  Abdominal distention with tenderness to palpation of suprapubic region.   Musculoskeletal: He exhibits no edema or tenderness.  Neurological: He is alert.  Skin: Skin is warm and dry.  Psychiatric: He has a normal mood and affect. His behavior is normal.  Nursing note and vitals reviewed.   Assessment/Plan:  Principal Problem:   Metastatic squamous cell carcinoma (HCC) Active Problems:   Smoking   Hypercalcemia   Pulmonary nodules/lesions, multiple   Protein-calorie malnutrition, severe   Constipation   Anemia of chronic disease  Mr.Gamachehas poorly differentiatedSCCof the neck with metastasisto the lungs and bone s/p wound excision.He is currently awaiting SNF placement. He has had urinary retention beginning last night--likely from use of opioids. Last bladder scan showed a little more than 800 mL of retained urine. Foley placement attempted last night was unsuccessful due to significant resistance.  Nursing staff will attempt again this  morning. I have also placed an order for Coude catheter placement by the Coude team.  Metastatic squamous cell carcinoma 1.ContinueDilaudid 0.5 mg every 3 hours PRN and Oxy IR 5 mg every 4 hours PRN.  Pathologic fracture at C2:MRI showed widespread bone metastasis throughout cervical spine with multiple associated pathologic fractures.Neurosurgery does not recommend surgery or c-collar at this time. 1. Continue PT  Urinary Retention: Likely secondary to opioid use. 1. Nursing staff will attempt Foley Placement per above  Normocytic anemia:Etiology unclear. Hb improved to 9.1 after 2 units of pRBC tomorrow. He completed a 5 day course of  Vitamin B12 and Folate replacement. 1.Ordered 2 units pRBC 1. Continuevitamin B12 1000 mcg once weekly for 4 weeks (next dose December 1st).  4.Continue1 mg folate once daily indefinitely  Euvolemic Hyponatremia:Stable.Likely secondary tomalnutrition. 1.Continue to monitor  Malnutrition: 1. Continue nutritional supplement  Dispo: Anticipated discharge when SNF placement opens up.  Carroll Sage, MD 09/24/2018, 8:49 AM Pager: My Pager: [phone]

## 2018-09-25 DIAGNOSIS — Z5329 Procedure and treatment not carried out because of patient's decision for other reasons: Secondary | ICD-10-CM

## 2018-09-25 LAB — CBC WITH DIFFERENTIAL/PLATELET
BLASTS: 0 %
Band Neutrophils: 0 %
Basophils Absolute: 0.1 10*3/uL (ref 0.0–0.1)
Basophils Relative: 1 %
EOS PCT: 1 %
Eosinophils Absolute: 0.1 10*3/uL (ref 0.0–0.5)
HEMATOCRIT: 27.2 % — AB (ref 39.0–52.0)
Hemoglobin: 9 g/dL — ABNORMAL LOW (ref 13.0–17.0)
LYMPHS ABS: 1.7 10*3/uL (ref 0.7–4.0)
Lymphocytes Relative: 14 %
MCH: 28 pg (ref 26.0–34.0)
MCHC: 33.1 g/dL (ref 30.0–36.0)
MCV: 84.5 fL (ref 80.0–100.0)
METAMYELOCYTES PCT: 0 %
MONOS PCT: 11 %
Monocytes Absolute: 1.3 10*3/uL — ABNORMAL HIGH (ref 0.1–1.0)
Myelocytes: 0 %
NEUTROS ABS: 8.9 10*3/uL — AB (ref 1.7–7.7)
NRBC: 0 /100{WBCs}
NRBC: 1.2 % — AB (ref 0.0–0.2)
Neutrophils Relative %: 73 %
OTHER: 0 %
Platelets: 125 10*3/uL — ABNORMAL LOW (ref 150–400)
Promyelocytes Relative: 0 %
RBC: 3.22 MIL/uL — ABNORMAL LOW (ref 4.22–5.81)
RDW: 15.9 % — AB (ref 11.5–15.5)
WBC: 12.1 10*3/uL — ABNORMAL HIGH (ref 4.0–10.5)

## 2018-09-25 LAB — BASIC METABOLIC PANEL
Anion gap: 11 (ref 5–15)
BUN: 41 mg/dL — ABNORMAL HIGH (ref 8–23)
CALCIUM: 9.4 mg/dL (ref 8.9–10.3)
CO2: 24 mmol/L (ref 22–32)
Chloride: 96 mmol/L — ABNORMAL LOW (ref 98–111)
Creatinine, Ser: 0.83 mg/dL (ref 0.61–1.24)
GFR calc Af Amer: >60 mL/min (ref >60)
GFR calc non Af Amer: >60 mL/min (ref >60)
GLUCOSE: 119 mg/dL — AB (ref 70–99)
Potassium: 4.7 mmol/L (ref 3.5–5.1)
Sodium: 131 mmol/L — ABNORMAL LOW (ref 135–145)

## 2018-09-25 NOTE — Progress Notes (Signed)
  Plastic Surgery POD# 13 excision left neck lesion application Integra  Events noted  Temp:  [97.5 F (36.4 C)-98.2 F (36.8 C)] 97.5 F (36.4 C) (11/27 0440) Pulse Rate:  [96-101] 96 (11/27 0440) Resp:  [16-18] 16 (11/27 0440) BP: (118-132)/(77-82) 126/79 (11/27 0440) SpO2:  [93 %-96 %] 95 % (11/27 0440) Weight:  [56.8 kg] 56.8 kg (11/27 0500)    PE: Patient sleeping, Dressing changed this am by staff, moist to dry in place Wound clean small drainage, increasing granulation  A/P  Continue hydrogel or moist to dry to wound and cover with dry dressing. Ok to continue to shower normally, soap and water ok.   [name], MD [facility] Plastic & Reconstructive Surgery [phone], pin [phone]

## 2018-09-25 NOTE — Progress Notes (Signed)
Bladder scan showed 915 ml of urine. Pt was educated on importance of receiving foley and possible complications/ risks that could occur if he does not get one. Pt refused multiple times. Doctor notified. Will keep monitoring.

## 2018-09-25 NOTE — Progress Notes (Signed)
   Subjective: Austin Payne has had urinary retention yesterday.  Bladder scan showed almost 1 L of urine in his bladder.  He has refused Foley catheter placement and has spontaneously voided 600 mL in the last 24 hours.  He was also prescribed tamsulosin yesterday.  He states that he would like to void on his own and does not want a Foley catheter.  I explained to him the risks of long-term bladder distention and urinary retention.  He expressed understanding and agreement but adamantly refuses a Foley.  He denies any abdominal pain and does not have any other acute complaints.  Objective:  Vital signs in last 24 hours: Vitals:   09/24/18 2159 09/25/18 0440 09/25/18 0500 09/25/18 1117  BP: 130/77 126/79  124/78  Pulse: (!) 101 96  80  Resp: 16 16    Temp: 98.2 F (36.8 C) (!) 97.5 F (36.4 C)    TempSrc: Oral Oral    SpO2: 93% 95%    Weight:   56.8 kg   Height:       Physical Exam  Constitutional: He appears well-developed and well-nourished.  HENT:  Head: Normocephalic and atraumatic.  Bandage in place over postop surgical site on the left side of his neck.  Eyes: EOM are normal.  Neck: Normal range of motion.  Cardiovascular: Normal rate, regular rhythm and normal heart sounds.  Pulmonary/Chest: Effort normal and breath sounds normal. No respiratory distress.  Abdominal:  Distention of his abdomen worse in the suprapubic region.  I can easily palpate his enlarged distended bladder area. No tenderness to palpation.  Musculoskeletal: He exhibits no edema or tenderness.  Neurological: He is alert.  Skin: Skin is warm and dry.  Psychiatric: He has a normal mood and affect. His behavior is normal.  Nursing note and vitals reviewed.   Assessment/Plan:  Principal Problem:   Metastatic squamous cell carcinoma (HCC) Active Problems:   Smoking   Hypercalcemia   Pulmonary nodules/lesions, multiple   Protein-calorie malnutrition, severe   Constipation   Anemia of chronic  disease  [name] poorly differentiatedSCCof the neck with metastasisto the lungs and bone s/p wound excision.He is currently awaiting SNF placement. He has had urinary retention beginning last night--likely from use of opioids. Last bladder scan showed over 900 mL of retained urine. He is refusing foley catheter placement and wishes to void on his own. I explained the risks of his urinary retention and he expressed understanding but continues to refuse foley placement. We will continue to monitor his urinary retention.  Metastatic squamous cell carcinoma: Stable. Will follow-up with oncology as an outpatient.  Pathologic fracture at C2:MRI showed widespread bone metastasis throughout cervical spine with multiple associated pathologic fractures.Neurosurgery does not recommend surgery or c-collar at this time. 1. Continue PT  Urinary Retention: Likely secondary to opioid use. He has not had any opioids in over 24 hours. I have discontinued them for now. He is also refusing foley per above. 1. Continue to monitor.  Normocytic anemia:Etiology unclear. Hb stable at 9.0. Will continue Vitamin B12 and Folate supplementation per below.  1. Continuevitamin B12 1000 mcg once weekly for 4 weeks (next dose December 1st).  2.Continue1 mg folate once daily indefinitely  Euvolemic Hyponatremia:Stable.Na today 131. Likely secondary tomalnutrition. 1.Continue to monitor  Malnutrition: 1. Continue nutritional supplement  Dispo: Anticipated dischargewhen SNF placement opens up.  Carroll Sage, MD 09/25/2018, 1:14 PM Pager: [phone]

## 2018-09-26 LAB — BASIC METABOLIC PANEL
Anion gap: 14 (ref 5–15)
BUN: 39 mg/dL — ABNORMAL HIGH (ref 8–23)
CALCIUM: 9.3 mg/dL (ref 8.9–10.3)
CO2: 23 mmol/L (ref 22–32)
Chloride: 95 mmol/L — ABNORMAL LOW (ref 98–111)
Creatinine, Ser: 0.64 mg/dL (ref 0.61–1.24)
GFR calc Af Amer: >60 mL/min (ref >60)
GLUCOSE: 106 mg/dL — AB (ref 70–99)
Potassium: 4 mmol/L (ref 3.5–5.1)
Sodium: 132 mmol/L — ABNORMAL LOW (ref 135–145)

## 2018-09-26 LAB — PHOSPHORUS: Phosphorus: 4.4 mg/dL (ref 2.5–4.6)

## 2018-09-26 LAB — MAGNESIUM: Magnesium: 2.2 mg/dL (ref 1.7–2.4)

## 2018-09-26 MED ORDER — LIDOCAINE HCL URETHRAL/MUCOSAL 2 % EX GEL
1.0000 "application " | Freq: Once | CUTANEOUS | Status: AC
  Administered 2018-09-26: 1 via URETHRAL
  Filled 2018-09-26 (×4): qty 5

## 2018-09-26 MED ORDER — KETOROLAC TROMETHAMINE 15 MG/ML IJ SOLN
30.0000 mg | Freq: Once | INTRAMUSCULAR | Status: AC
  Administered 2018-09-26: 30 mg via INTRAVENOUS
  Filled 2018-09-26: qty 2

## 2018-09-26 MED ORDER — KETOROLAC TROMETHAMINE 15 MG/ML IJ SOLN
30.0000 mg | Freq: Four times a day (QID) | INTRAMUSCULAR | Status: AC | PRN
  Administered 2018-09-26 – 2018-09-30 (×8): 30 mg via INTRAVENOUS
  Filled 2018-09-26 (×9): qty 2

## 2018-09-26 MED ORDER — ACETAMINOPHEN 325 MG PO TABS
650.0000 mg | ORAL_TABLET | Freq: Once | ORAL | Status: AC
  Administered 2018-09-26: 650 mg via ORAL
  Filled 2018-09-26: qty 2

## 2018-09-26 NOTE — Progress Notes (Addendum)
Bladder scan showed greater than 999 mL. I went to bedside to speak to Austin Payne about placing a Foley/Coude. He states that "you guys tried 5 times and I'm still feeling the pain." I explained to him that we could have the [place] team place the catheter--usually an easier insertion. I also explained risk/benefits of placing a Coude vs continued urinary retention. He states that he "will think about it." We will continue to monitor his urinary retention and speak to him later today about Coude placement.

## 2018-09-26 NOTE — Progress Notes (Signed)
Notified by nurse tech that patient refusing foley care. Discussed with patient the reasons for foley care and the risks associated with a foley. Pt stated that the doctors told him that they would be the only ones to touch the foley, and not the nurses or techs. Advised pt that normally it is the nurse techs that provide foley care. Patient consented to receive foley care as long as the RN stood by to observe. Foley care was performed on patient.

## 2018-09-26 NOTE — Progress Notes (Signed)
Pt bladder scan showed greater than 999 Dr. Alfonse Spruce  notified

## 2018-09-26 NOTE — Progress Notes (Addendum)
Subjective: Mr. Janoski states that he has been unable to sleep for the last 2 nights because of the pain that he has had in his lower back and legs. He does not believe that the tylenol he is getting is adequate. He also continues to have urinary retention but does state that he has been able to urinate on his own. He denies any abdominal pain. He continues to refuse a foley/coude catheter despite understanding the risks of prolonged abdominal distention.  Objective:  Vital signs in last 24 hours: Vitals:   09/25/18 0500 09/25/18 1117 09/25/18 2114 09/26/18 0624  BP:  124/78 137/73 120/79  Pulse:  80 92 84  Resp:   19 18  Temp:   (!) 97.3 F (36.3 C) 98 F (36.7 C)  TempSrc:   Oral Oral  SpO2:   99% 97%  Weight: 56.8 kg     Height:       Physical Exam  Constitutional: He appears well-developed and well-nourished.  HENT:  Head: Normocephalic and atraumatic.  Eyes: EOM are normal.  Neck: Normal range of motion.  Cardiovascular: Normal rate, regular rhythm and normal heart sounds.  Pulmonary/Chest: Effort normal and breath sounds normal.  Abdominal:  Distention present at the suprapubic region of the abdomen. I am able to palpate his enlarged bladder easily. No tenderness to palpation of the abdomen.   Neurological: He is alert.  Skin: Skin is warm and dry.  Psychiatric: He has a normal mood and affect. His behavior is normal.  Nursing note and vitals reviewed.   Assessment/Plan:  Principal Problem:   Metastatic squamous cell carcinoma (HCC) Active Problems:   Smoking   Hypercalcemia   Pulmonary nodules/lesions, multiple   Protein-calorie malnutrition, severe   Constipation   Anemia of chronic disease  Mr.Gamachehas poorly differentiatedSCCof the neck with metastasisto the lungs and bone s/p wound excision.He is currently awaiting SNF placement.He has had urinary retention over the last several days--likely from use of opioids. Last bladder scanperformed  yesterday showed 915 mL ofretained urine.He is refusing foley catheter placement and wishes to void on his own. I have explained the risks of his urinary retention and he expressed understanding but continues to refuse foley placement. We will continue to monitor his urinary retention. Additionally, he continues to have low back pain and LE pain likely from his osseous metastasis. Phos and Mag within acceptable limits--suggesting against electrolyte abnormality contributing to the pain. I am trying to avoid opiates given that this will worsen his urinary retention. Will order Toradol IV to help alleviate pain.   Metastatic squamous cell carcinoma: Stable. Will follow-up with oncology as an outpatient.  Pathologic fracture at C2:MRI showed widespread bone metastasis throughout cervical spine with multiple associated pathologic fractures.Neurosurgery does not recommend surgery or c-collar at this time. 1. Continue PT  Urinary Retention: Likely secondary to opioid use. Refusing foley. 345 mL out over last 24 ours. 1. Continue to monitor.  Back and LE Pain: Likely from osseous mets. 1. Ordered Toradol 30 mg IV now and Toradol 30 mg IV PRN q6h 2. Continue Tylenol 650 mg q6h  Normocytic anemia:Etiology unclear.Hbstable at 9.0. Will continue Vitamin B12 and Folate supplementation per below.  1.Continuevitamin B12 1000 mcg once weekly for 4 weeks(next dose December 1st). 2.Continue1 mg folate once dailyindefinitely  Euvolemic Hyponatremia:Stable.Likely secondary tomalnutrition. 1.Continue to monitor  Malnutrition: 1. Continue nutritional supplement  Dispo: Anticipated discharge within 7-10 days due to being difficult to place in SNF.   Carroll Sage, MD  09/26/2018, 7:39 AM Pager: [phone]

## 2018-09-27 DIAGNOSIS — Z960 Presence of urogenital implants: Secondary | ICD-10-CM

## 2018-09-27 LAB — BASIC METABOLIC PANEL
ANION GAP: 10 (ref 5–15)
BUN: 46 mg/dL — ABNORMAL HIGH (ref 8–23)
CALCIUM: 9.1 mg/dL (ref 8.9–10.3)
CO2: 25 mmol/L (ref 22–32)
CREATININE: 0.85 mg/dL (ref 0.61–1.24)
Chloride: 95 mmol/L — ABNORMAL LOW (ref 98–111)
GLUCOSE: 97 mg/dL (ref 70–99)
Potassium: 4 mmol/L (ref 3.5–5.1)
Sodium: 130 mmol/L — ABNORMAL LOW (ref 135–145)

## 2018-09-27 MED ORDER — OXYCODONE HCL 5 MG PO TABS
5.0000 mg | ORAL_TABLET | Freq: Four times a day (QID) | ORAL | Status: DC | PRN
  Administered 2018-09-27 – 2018-10-03 (×10): 5 mg via ORAL
  Filled 2018-09-27 (×10): qty 1

## 2018-09-27 NOTE — Progress Notes (Signed)
PT Cancellation Note  Patient Details Name: Austin Payne MRN: [id number] DOB: 12/24/1955   Cancelled Treatment:    Reason Eval/Treat Not Completed: Patient declined, no reason specified.  He is declining bed ex so will try again at another time.   Ramond Dial 09/27/2018, 3:26 PM   Mee Hives, PT MS Acute Rehab Dept. Number: [place]

## 2018-09-27 NOTE — Progress Notes (Signed)
Patient remains on Difficult to Place waitlist.   Cedric Fishman LCSW [phone]

## 2018-09-27 NOTE — Progress Notes (Addendum)
   Subjective: Patient is feeling well this AM. He is feeling better since getting the catheter in place. His pain needs to be a little better. He states that the narcotics are working better. We discussed switching him back to the opiates to better control his pain. Last bowel movement last night. All questions and concerns addressed.   Objective: Vital signs in last 24 hours: Vitals:   09/26/18 0836 09/26/18 1429 09/26/18 2137 09/27/18 0440  BP: 120/76 118/65 105/66 115/66  Pulse: 78 83 94 83  Resp:  16 19 19   Temp:  98 F (36.7 C) 98.5 F (36.9 C) 98.5 F (36.9 C)  TempSrc:  Oral Oral Oral  SpO2:  98% 98% 96%  Weight:      Height:       Physical Exam  Constitutional: He appears well-developed and well-nourished.  HENT:  Head: Normocephalic and atraumatic.  Bandage in place over postop surgical site on the left side of his neck.  Eyes: EOM are normal.  Neck: Normal range of motion.  Cardiovascular: Normal rate, regular rhythm and normal heart sounds.  Pulmonary/Chest: Effort normal and breath sounds normal. No respiratory distress.  Abdominal:  Distention is significantly improved. Minimal tenderness to palpation.   Musculoskeletal: He exhibits no edema or tenderness.  Neurological: He is alert.  Skin: Skin is warm and dry.  Psychiatric: He has a normal mood and affect. His behavior is normal.  Nursing note and vitals reviewed.  Assessment/Plan:  Principal Problem:   Metastatic squamous cell carcinoma (HCC) Active Problems:   Smoking   Hypercalcemia   Pulmonary nodules/lesions, multiple   Protein-calorie malnutrition, severe   Constipation   Anemia of chronic disease  Mr.Gamachehas poorly differentiatedSCCof the neck with metastasisto the lungs and bone s/p wound excision.He is currently awaiting SNF placement.   Metastatic squamous cell carcinoma: Stable. Will follow-up with oncology as an outpatient.  Pathologic fracture at C2:MRI showed widespread  bone metastasis throughout cervical spine with multiple associated pathologic fractures.Neurosurgery does not recommend surgery or c-collar at this time. 1. Continue Oxy IR 5 mg every 6 hours PRN and Toradol 30 mg every 6 hours PRN  2. Continue bowel regimen with miralax and senokot  3. Continue PT  Urinary Retention: Likely secondary to opioid use. Foley in place.  1. Continue to monitor.  Normocytic anemia:Etiology unclear. Hb stable at 9.0. Will continue Vitamin B12 and Folate supplementation per below.  1. Continuevitamin B12 1000 mcg once weekly for 4 weeks (next dose December 1st).  2.Continue1 mg folate once daily indefinitely  Euvolemic Hyponatremia:Stable.Na today 130. Likely secondary tomalnutrition. 1.Continue to monitor  Malnutrition: 1. Continue nutritional supplement  Dispo: Anticipated dischargewhen SNF placement opens up.  Ina Homes, MD 09/27/2018, 9:02 AM

## 2018-09-28 NOTE — Progress Notes (Signed)
   Subjective: Patient feels well this morning, no complaints, he is eager to be placed.    Objective: Vital signs in last 24 hours: Vitals:   09/27/18 0928 09/27/18 1452 09/27/18 2110 09/28/18 0543  BP: 111/68 107/60 114/65 105/68  Pulse: 80 91 95 97  Resp:  18 18 18   Temp:  97.7 F (36.5 C) 99 F (37.2 C) 99.5 F (37.5 C)  TempSrc:  Oral Oral Oral  SpO2:  97% 97% 95%  Weight:      Height:       Cardiac: normal rate and rhythm, clear s1 and s2, no murmurs, rubs or gallops Neck: bandage over neck wound is clean and dry Pulmonary: CTAB, not in distress Abdominal: non distended abdomen, soft and nontender Extremities: no LE edema Psych: Alert, conversant, in good spirits   Assessment/Plan:  Principal Problem:   Metastatic squamous cell carcinoma (HCC) Active Problems:   Smoking   Hypercalcemia   Pulmonary nodules/lesions, multiple   Protein-calorie malnutrition, severe   Constipation   Anemia of chronic disease  Mr.Gamachehas poorly differentiatedSCCof the neck with metastasisto the lungs and bone s/p wound excision.He is currently awaiting SNF placement.   Metastatic squamous cell carcinoma: Stable. Will follow-up with oncology as an outpatient.  Pathologic fracture at C2:MRI showed widespread bone metastasis throughout cervical spine with multiple associated pathologic fractures.Neurosurgery does not recommend surgery or c-collar at this time. 1. Continue Oxy IR 5 mg every 6 hours PRN and Toradol 30 mg every 6 hours PRN  2. Continue bowel regimen with miralax and senokot  3. Continue PT  Urinary Retention: Likely secondary to opioid use. Foley in place.  1. Continue to monitor.  Normocytic anemia:ACD. Hb stable at 9.0. Will continue Vitamin B12 and Folate supplementation per below.  1. Continuevitamin B12 1000 mcg once weekly for 4 weeks (next dose December 1st).  2.Continue1 mg folate once daily indefinitely  Euvolemic Hyponatremia:Stable.  Likely secondary tomalnutrition. 1.Continue to monitor  Malnutrition: 1. Continue nutritional supplement  Dispo: Anticipated dischargewhen SNF placement opens up.  Katherine Roan, MD 09/28/2018, 10:27 AM

## 2018-09-28 NOTE — Progress Notes (Signed)
Neck wound care was done.

## 2018-09-29 LAB — CBC
HCT: 23.4 % — ABNORMAL LOW (ref 39.0–52.0)
HEMOGLOBIN: 7.4 g/dL — AB (ref 13.0–17.0)
MCH: 27.6 pg (ref 26.0–34.0)
MCHC: 31.6 g/dL (ref 30.0–36.0)
MCV: 87.3 fL (ref 80.0–100.0)
PLATELETS: 132 10*3/uL — AB (ref 150–400)
RBC: 2.68 MIL/uL — ABNORMAL LOW (ref 4.22–5.81)
RDW: 15.7 % — ABNORMAL HIGH (ref 11.5–15.5)
WBC: 12 10*3/uL — AB (ref 4.0–10.5)
nRBC: 1.4 % — ABNORMAL HIGH (ref 0.0–0.2)

## 2018-09-29 LAB — BASIC METABOLIC PANEL
Anion gap: 14 (ref 5–15)
BUN: 40 mg/dL — ABNORMAL HIGH (ref 8–23)
CHLORIDE: 91 mmol/L — AB (ref 98–111)
CO2: 27 mmol/L (ref 22–32)
Calcium: 9 mg/dL (ref 8.9–10.3)
Creatinine, Ser: 0.88 mg/dL (ref 0.61–1.24)
GFR calc Af Amer: >60 mL/min (ref >60)
Glucose, Bld: 92 mg/dL (ref 70–99)
POTASSIUM: 4.5 mmol/L (ref 3.5–5.1)
SODIUM: 132 mmol/L — AB (ref 135–145)

## 2018-09-29 MED ORDER — ENOXAPARIN SODIUM 40 MG/0.4ML ~~LOC~~ SOLN
40.0000 mg | SUBCUTANEOUS | Status: DC
  Administered 2018-09-29 – 2018-09-30 (×2): 40 mg via SUBCUTANEOUS
  Filled 2018-09-29 (×5): qty 0.4

## 2018-09-29 NOTE — Progress Notes (Signed)
Neck wound care was done.

## 2018-09-29 NOTE — Progress Notes (Signed)
   Subjective: Patient did not sleep very well, is frustrated at his length of stay.  He denies any SOB or chest pain  Objective: Vital signs in last 24 hours: Vitals:   09/28/18 1523 09/28/18 2033 09/29/18 0528 09/29/18 1037  BP: 115/69 106/69 115/67 139/70  Pulse: 94 96 96 93  Resp: 20 16 18    Temp: 99.8 F (37.7 C) 99.2 F (37.3 C) 99.2 F (37.3 C)   TempSrc: Oral Oral Oral   SpO2: 97% 97% 96%   Weight:   57.1 kg   Height:       Cardiac: normal rate and rhythm, clear s1 and s2, no murmurs, rubs or gallops Neck: bandage over neck wound is clean and dry Pulmonary: CTAB, not in distress Abdominal: non distended abdomen, soft and nontender Extremities: no LE edema Psych: Alert, conversant   Assessment/Plan:  Principal Problem:   Metastatic squamous cell carcinoma (HCC) Active Problems:   Smoking   Hypercalcemia   Pulmonary nodules/lesions, multiple   Protein-calorie malnutrition, severe   Constipation   Anemia of chronic disease  Mr.Gamachehas poorly differentiatedSCCof the neck with metastasisto the lungs and bone s/p wound excision.He is currently awaiting SNF placement.   Metastatic squamous cell carcinoma: Stable. Will follow-up with oncology as an outpatient.  Pathologic fracture at C2:MRI showed widespread bone metastasis throughout cervical spine with multiple associated pathologic fractures.Neurosurgery does not recommend surgery or c-collar at this time. 1. Continue Oxy IR 5 mg every 6 hours PRN and Toradol 30 mg every 6 hours PRN  2. Continue bowel regimen with miralax and senokot  3. Continue PT  Urinary Retention: Likely secondary to opioid use. Foley in place.  1. Continue to monitor.  Normocytic anemia:ACD. Hb stable at 9.0. Will continue Vitamin B12 and Folate supplementation per below.  1. Continuevitamin B12 1000 mcg once weekly for 4 weeks (next dose December 1st).  2.Continue1 mg folate once daily indefinitely  Euvolemic  Hyponatremia:Stable. Likely secondary tomalnutrition. 1.Continue to monitor  Malnutrition: 1. Continue nutritional supplement  Dispo: Anticipated dischargewhen SNF placement opens up.  Katherine Roan, MD 09/29/2018, 10:42 AM

## 2018-09-30 LAB — BASIC METABOLIC PANEL
ANION GAP: 13 (ref 5–15)
BUN: 39 mg/dL — ABNORMAL HIGH (ref 8–23)
CALCIUM: 9.2 mg/dL (ref 8.9–10.3)
CO2: 25 mmol/L (ref 22–32)
Chloride: 92 mmol/L — ABNORMAL LOW (ref 98–111)
Creatinine, Ser: 0.83 mg/dL (ref 0.61–1.24)
Glucose, Bld: 109 mg/dL — ABNORMAL HIGH (ref 70–99)
Potassium: 4.7 mmol/L (ref 3.5–5.1)
Sodium: 130 mmol/L — ABNORMAL LOW (ref 135–145)

## 2018-09-30 LAB — CBC
HEMATOCRIT: 22.4 % — AB (ref 39.0–52.0)
Hemoglobin: 7.2 g/dL — ABNORMAL LOW (ref 13.0–17.0)
MCH: 27.9 pg (ref 26.0–34.0)
MCHC: 32.1 g/dL (ref 30.0–36.0)
MCV: 86.8 fL (ref 80.0–100.0)
NRBC: 1.2 % — AB (ref 0.0–0.2)
PLATELETS: 133 10*3/uL — AB (ref 150–400)
RBC: 2.58 MIL/uL — ABNORMAL LOW (ref 4.22–5.81)
RDW: 15.7 % — ABNORMAL HIGH (ref 11.5–15.5)
WBC: 10.3 10*3/uL (ref 4.0–10.5)

## 2018-09-30 MED ORDER — BOOST / RESOURCE BREEZE PO LIQD CUSTOM
1.0000 | Freq: Two times a day (BID) | ORAL | Status: DC
  Administered 2018-09-30 – 2018-10-14 (×24): 1 via ORAL
  Administered 2018-10-14: 237 mL via ORAL
  Administered 2018-10-16 – 2018-10-17 (×2): 1 via ORAL

## 2018-09-30 NOTE — Progress Notes (Signed)
   Subjective: Mr. Shaul does not have any acute complaints and denies chest pain, shortness of breath, abdominal pain.  He does state that he has a personal history of like to speak to me later on today.  Objective:  Vital signs in last 24 hours: Vitals:   09/29/18 0528 09/29/18 1037 09/29/18 1633 09/30/18 0551  BP: 115/67 139/70 100/62 98/65  Pulse: 96 93 93 88  Resp: 18  18   Temp: 99.2 F (37.3 C)  99.3 F (37.4 C) 99.1 F (37.3 C)  TempSrc: Oral  Oral Oral  SpO2: 96%  98% 94%  Weight: 57.1 kg     Height:       Physical Exam  Constitutional:  Thin appearing male lying in bed in no acute distress.  HENT:  Head: Normocephalic and atraumatic.  Eyes: Conjunctivae and EOM are normal.  Neck:  Pink glistening integra dermal substrate in place at surgical site of left-neck wound. No significant drainage noted.  Cardiovascular: Normal rate, regular rhythm and normal heart sounds.  Pulmonary/Chest:  Lungs auscultated anteriorly with normal breath sounds.  No respiratory distress.  Abdominal: Soft. Bowel sounds are normal. He exhibits no distension. There is no tenderness.  Musculoskeletal: He exhibits no edema or tenderness.  Neurological: He is alert.  Skin: Skin is warm and dry.  Psychiatric: He has a normal mood and affect. His behavior is normal.    Assessment/Plan:  Principal Problem:   Metastatic squamous cell carcinoma (HCC) Active Problems:   Smoking   Hypercalcemia   Pulmonary nodules/lesions, multiple   Protein-calorie malnutrition, severe   Constipation   Anemia of chronic disease   Mr.Gamachehas poorly differentiatedSCCof the neck with metastasisto the lungs and bone s/p wound excision.He is currently awaiting SNF placement.  Metastatic squamous cell carcinoma: Stable. Will follow-up with oncology as an outpatient.  Pathologic fracture at C2:MRI showed widespread bone metastasis throughout cervical spine with multiple associated pathologic  fractures.Neurosurgery does not recommend surgery or c-collar at this time. 1. Continue Oxy IR 5 mg every 6 hours PRN and Toradol 30 mg every 6 hours PRN  2. Continue bowel regimen with miralax and senokot  3. Continue PT  Urinary Retention: Likely secondary to opioid use.  Coude in place.  He will need this in place for at least 1 month due to potential stretch injury 2/2 acute urinary retention.  He will also need to follow-up with urology as an outpatient prior to removing coud catheter. 1. Continue to monitor  Normocytic anemia:Likely due to anemia of chronic disease. Hbdowntrending--today 7.2.  He is currently asymptomatic.  Will continue Vitamin B12 and Folate supplementation per below.  Recheck a CBC in the morning. 1.Continuevitamin B12 1000 mcg once weekly for 4 weeks. 2.Continue1 mg folate once dailyindefinitely  Euvolemic Hyponatremia:Stable. Likely secondary tomalnutrition. 1.Continue to monitor  Malnutrition: 1. Continue nutritional supplement  Dispo: Anticipated discharge pending SNF placement.  Carroll Sage, MD 09/30/2018, 6:38 AM Pager: [phone]

## 2018-09-30 NOTE — Progress Notes (Signed)
  Plastic Surgery POD# 18 excision left neck lesion application Integra  Hb 7.2 today Patient asking "when is it going to heal?" Notes dressing needs to be changed every hour  Temp:  [99.1 F (37.3 C)-99.3 F (37.4 C)] 99.1 F (37.3 C) (12/02 0551) Pulse Rate:  [88-93] 88 (12/02 0551) Resp:  [18] 18 (12/01 1633) BP: (98-100)/(62-65) 98/65 (12/02 0551) SpO2:  [94 %-98 %] 94 % (12/02 0551)    PE: Wound 100% granulated, new dressing in place that is dry.  A/P Reviewed with patient all margins positive- I anticipate at some time the tumor will regrow. I do not feel the tumor could be cleared without radical resection and pectoralis flap. We have agreed he does not want this much surgery, and I do not recommend it in setting metastatic disease.  Discussed skin graft- again this may allow quicker healing of wound with trade off donor site pain and healing, and again anticipate regrowth tumor. Declines this.  If drainage is the main concern can switch to silver alginate daily to try to control moisture.  [name], MD [facility] [phone], pin [phone]
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
## 2018-10-01 DIAGNOSIS — L8990 Pressure ulcer of unspecified site, unspecified stage: Secondary | ICD-10-CM | POA: Diagnosis present

## 2018-10-01 DIAGNOSIS — M8448XD Pathological fracture, other site, subsequent encounter for fracture with routine healing: Secondary | ICD-10-CM

## 2018-10-01 LAB — BASIC METABOLIC PANEL
ANION GAP: 14 (ref 5–15)
BUN: 36 mg/dL — ABNORMAL HIGH (ref 8–23)
CALCIUM: 9.3 mg/dL (ref 8.9–10.3)
CO2: 24 mmol/L (ref 22–32)
CREATININE: 0.97 mg/dL (ref 0.61–1.24)
Chloride: 92 mmol/L — ABNORMAL LOW (ref 98–111)
GFR calc non Af Amer: >60 mL/min (ref >60)
Glucose, Bld: 114 mg/dL — ABNORMAL HIGH (ref 70–99)
Potassium: 4.8 mmol/L (ref 3.5–5.1)
SODIUM: 130 mmol/L — AB (ref 135–145)

## 2018-10-01 LAB — CBC WITH DIFFERENTIAL/PLATELET
BASOS ABS: 0.1 10*3/uL (ref 0.0–0.1)
Basophils Relative: 1 %
EOS ABS: 0.1 10*3/uL (ref 0.0–0.5)
EOS PCT: 1 %
HCT: 22.6 % — ABNORMAL LOW (ref 39.0–52.0)
Hemoglobin: 7.3 g/dL — ABNORMAL LOW (ref 13.0–17.0)
Lymphocytes Relative: 16 %
Lymphs Abs: 1.7 10*3/uL (ref 0.7–4.0)
MCH: 27.8 pg (ref 26.0–34.0)
MCHC: 32.3 g/dL (ref 30.0–36.0)
MCV: 85.9 fL (ref 80.0–100.0)
Monocytes Absolute: 1.4 10*3/uL — ABNORMAL HIGH (ref 0.1–1.0)
Monocytes Relative: 13 %
NEUTROS PCT: 69 %
NRBC: 1.5 % — AB (ref 0.0–0.2)
Neutro Abs: 7.4 10*3/uL (ref 1.7–7.7)
PLATELETS: 130 10*3/uL — AB (ref 150–400)
RBC: 2.63 MIL/uL — AB (ref 4.22–5.81)
RDW: 15.6 % — AB (ref 11.5–15.5)
WBC: 10.7 10*3/uL — AB (ref 4.0–10.5)

## 2018-10-01 NOTE — Progress Notes (Signed)
   10/01/18 1017  Clinical Encounter Type  Visited With Patient  Visit Type Follow-up  Referral From Chaplain  Consult/Referral To [place] followed up on Pt. Spiritual care.  The Pt. welcomed the chaplain into the room.  The chaplain listened as the Pt shared his personal goals of getting out of the bed and caring for himself in the bathroom. The chaplain heard the Pt. state he would ask for help from the RN before trying his goals himself.  The Pt. revisited the topics of church/community and his belief in helping one another.  The Pt. prayer requests followed his conversation topics with the chaplain.  The Pt. thanked the chaplain for the spiritual care visit.

## 2018-10-01 NOTE — Progress Notes (Signed)
OT Cancellation Note  Patient Details Name: Austin Payne MRN: [id number] DOB: 1956/07/24  Pt refused any OOB or bed level participation in therapy. Unable to give reason "just don't feel like it".   Cancelled Treatment:    Reason Eval/Treat Not Completed: Patient declined, no reason specified  Curtis Sites  OTR/L  10/01/2018, 11:31 AM

## 2018-10-01 NOTE — Plan of Care (Signed)
Patient is making progress, pain controlled by oxycodone prn. No acute distress noted

## 2018-10-01 NOTE — Progress Notes (Signed)
   Subjective: Mr. Dubey states that he continues to have lower extremity and lower back pain but it is much improved with his current pain medication regiment. Otherwise he has no other acute complaints.  Objective:  Vital signs in last 24 hours: Vitals:   09/30/18 0551 09/30/18 1436 09/30/18 2138 10/01/18 0547  BP: 98/65 113/67 113/64 102/65  Pulse: 88 91 (!) 107 (!) 109  Resp:   18 18  Temp: 99.1 F (37.3 C) 98.7 F (37.1 C) (!) 100.8 F (38.2 C) 98.4 F (36.9 C)  TempSrc: Oral Oral Oral Oral  SpO2: 94% 95% 95% 94%  Weight:      Height:       Physical Exam  Constitutional:  Thin appearing male lying in bed in no acute distress.  Eyes: EOM are normal.  Pulmonary/Chest: Effort normal.  Neurological: He is alert.  Psychiatric: He has a normal mood and affect. His behavior is normal.    Assessment/Plan:  Principal Problem:   Metastatic squamous cell carcinoma (HCC) Active Problems:   Smoking   Hypercalcemia   Pulmonary nodules/lesions, multiple   Protein-calorie malnutrition, severe   Constipation   Anemia of chronic disease   Pressure injury of skin  Mr.Gamachehas poorly differentiatedSCCof the neck with metastasisto the lungs and bone s/p wound excision.He is currently awaiting SNF placement.  Metastatic squamous cell carcinoma: Stable. Will follow-up with oncology as an outpatient.  Pathologic fracture at C2:MRI showed widespread bone metastasis throughout cervical spine with multiple associated pathologic fractures.Neurosurgery does not recommend surgery or c-collar at this time. 1. Continue Oxy IR 5 mg every 6 hours PRN and Toradol 30 mg every 6 hours PRN  2. Continue bowel regimen with miralax and senokot  3. Continue PT  Urinary Retention: Likely secondary to opioid use. Coude placed 09/26/18.  He will need this in place for at least 1 month due to potential stretch injury 2/2 acute urinary retention.  He will also need to follow-up with  urology as an outpatient prior to removing coud catheter. 1. Continue to monitor  Normocytic anemia:Likely due to anemia of chronic disease. Hbstable today at 7.3. Will continue Vitamin B12 and Folate supplementation per below.  Recheck CBC in several days. 1.Continuevitamin B12 1000 mcg once weekly for 4 weeks. 2.Continue1 mg folate once dailyindefinitely  Euvolemic Hyponatremia:Stable at 130. Likely secondary tomalnutrition. 1.Continue to monitor  Malnutrition: 1. Continue nutritional supplement  CODE STATUS: DNR with full scope treatment.  Dispo: Anticipated discharge pending SNF placement.  Carroll Sage, MD 10/01/2018, 8:33 AM Pager: [phone]
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
## 2018-10-01 NOTE — Progress Notes (Signed)
This RN agrees with previous RN's assessment of pt. 
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
## 2018-10-02 NOTE — Progress Notes (Signed)
   Subjective: He does not have any acute complaints today.   Objective:  Vital signs in last 24 hours: Vitals:   10/01/18 1029 10/01/18 1425 10/01/18 2149 10/02/18 0452  BP: (!) 109/59 111/67 109/73 109/68  Pulse: 94 89 (!) 103 99  Resp:  18 16 16   Temp:  98 F (36.7 C) 99.4 F (37.4 C) 98.4 F (36.9 C)  TempSrc:   Oral Oral  SpO2:  97% 97% 97%  Weight:    55.6 kg  Height:       Physical Exam  Constitutional:  Lying in bed in no acute distress.  Neurological: He is alert.  Psychiatric: He has a normal mood and affect. His behavior is normal.    Assessment/Plan:  Principal Problem:   Metastatic squamous cell carcinoma (HCC) Active Problems:   Smoking   Hypercalcemia   Pulmonary nodules/lesions, multiple   Protein-calorie malnutrition, severe   Constipation   Anemia of chronic disease   Pressure injury of skin  Mr.Gamachehas poorly differentiatedSCCof the neck with metastasisto the lungs and bone s/p wound excision.He is currently awaiting SNF placement.  Metastatic squamous cell carcinoma: Stable. Will follow-up with oncology as an outpatient.  Pathologic fracture at C2:MRI showed widespread bone metastasis throughout cervical spine with multiple associated pathologic fractures.Neurosurgery does not recommend surgery or c-collar at this time. 1. Continue Oxy IR 5 mg every 6 hours PRN and Toradol 30 mg every 6 hours PRN  2. Continue bowel regimen with miralax and senokot  3. Continue PT  Urinary Retention: Likely secondary to opioid use.Coude placed 09/26/18. He will need this in place for at least 1 month due to potential stretch injury 2/2acute urinary retention.He will also need to follow-up with urology as an outpatient prior to removing coud catheter. 1. Continue to monitor  Normocytic anemia:Likely due to anemia of chronic disease.Hbstable today at 7.3.Will continue Vitamin B12 and Folate supplementation per below.Recheck CBC in  several days. 1.Continuevitamin B12 1000 mcg once weekly for 4 weeks. 2.Continue1 mg folate once dailyindefinitely  Euvolemic Hyponatremia:Stable. Likely secondary tomalnutrition. 1.Continue to monitor  Malnutrition: 1. Continue nutritional supplement  Asymptomatic penile discharge: Likely physiologic.  1. Continue to monitor.  CODE STATUS: DNR with full scope treatment.  Dispo: Anticipated discharge pending SNF placement.  Carroll Sage, MD 10/02/2018, 7:29 AM Pager: [phone]

## 2018-10-02 NOTE — Progress Notes (Signed)
NT informed me of abnormal discharge coming from pt's penis. Paged IM. Will continue to monitor.

## 2018-10-02 NOTE — Progress Notes (Signed)
Patient remains on Difficult to [place] LCSW [phone]

## 2018-10-02 NOTE — Progress Notes (Signed)
  Plastic Surgery POD# 20 excision left neck lesion application Integra  Sleeping, states back hip and leg pain limiting sleeping  Temp:  [98 F (36.7 C)-99.4 F (37.4 C)] 98.4 F (36.9 C) (12/04 0452) Pulse Rate:  [89-103] 99 (12/04 0452) Resp:  [16-18] 16 (12/04 0452) BP: (109-111)/(59-73) 109/68 (12/04 0452) SpO2:  [97 %] 97 % (12/04 0452) Weight:  [55.6 kg] 55.6 kg (12/04 0452)    PE: Wound 100% granulated, silver alginate and wet to dry dressings in palce  A/P Continue silver alginate daily to try to control moisture- no not need moist to dry dressings over this, just silver alginate and dry dressing to wound.  [name], MD [facility] Plastic & Reconstructive Surgery [phone], pin [phone]

## 2018-10-03 DIAGNOSIS — R369 Urethral discharge, unspecified: Secondary | ICD-10-CM

## 2018-10-03 MED ORDER — OXYCODONE HCL 5 MG PO TABS
10.0000 mg | ORAL_TABLET | Freq: Four times a day (QID) | ORAL | Status: DC | PRN
  Administered 2018-10-03 – 2018-10-11 (×18): 10 mg via ORAL
  Filled 2018-10-03 (×18): qty 2

## 2018-10-03 NOTE — Progress Notes (Signed)
OT Cancellation Note  Patient Details Name: Austin Payne MRN: [id number] DOB: July 03, 1956   Cancelled Treatment:    Reason Eval/Treat Not Completed: Patient declined, no reason specified. Pt states "I worked out yesterday for about an hour and a half & I'm not going to do anything today" Pt also reports generalized pain and rates as 4/10. Pt was encouraged to attempt position change or sitting up in chair but declined. Pt also declined needing to use toilet or perform ADL's. Pt was given extra pillow and verbally educated in using pillow between his knees when laying on his side as a possible means for increased comfort/positioning. Pt appeared appreciative of this. Will f/u as schedule permits for OT ADL retraining session.  Carlynn Herald Amy Beth Dixon, OTR/L  10/03/2018, 10:59 AM

## 2018-10-03 NOTE — Progress Notes (Signed)
   Subjective: Austin Payne states that he continues to have lower back pain and LE pain. He does state that it is relieved with the oxycodone. He is requesting a higher dose, stating that he usually takes it at night but is waking up several hours later with pain. He otherwise does not have any other acute complaints.  Objective:  Vital signs in last 24 hours: Vitals:   10/02/18 0452 10/02/18 1420 10/02/18 2314 10/03/18 0532  BP: 109/68 112/70 111/67 115/68  Pulse: 99 98 94 98  Resp: 16  16 17   Temp: 98.4 F (36.9 C) 98.5 F (36.9 C) 98.1 F (36.7 C) 98.6 F (37 C)  TempSrc: Oral Oral Oral Oral  SpO2: 97% 95% 97% 96%  Weight: 55.6 kg  55.9 kg   Height:       Physical Exam  Eyes: EOM are normal.  Neurological: He is alert.  Psychiatric: He has a normal mood and affect. His behavior is normal.  Nursing note and vitals reviewed.   Assessment/Plan:  Principal Problem:   Metastatic squamous cell carcinoma (HCC) Active Problems:   Smoking   Hypercalcemia   Pulmonary nodules/lesions, multiple   Protein-calorie malnutrition, severe   Constipation   Anemia of chronic disease   Pressure injury of skin   Austin Payne poorly differentiatedSCCof the neck with metastasisto the lungs and bone s/p wound excision. He is currently awaiting SNF placement.  Metastatic squamous cell carcinoma: Stable. Will follow-up with oncology as an outpatient.  Pathologic fracture at C2:MRI showed widespread bone metastasis throughout cervical spine with multiple associated pathologic fractures.Neurosurgery does not recommend surgery or c-collar at this time. 1. Increase Oxy IR to 10 mg every 6 hours PRN 2. Continue Toradol 30 mg every 6 hours PRN  3. Continue bowel regimen with miralax and senokot  4. Continue PT  Urinary Retention: Likely secondary to opioid use.Coudeplaced 09/26/18. He will need this in place for at least 1 month due to potential stretch injury 2/2acute  urinary retention.He will also need to follow-up with urology as an outpatient prior to removing coud catheter. 1. Continue to monitor  Normocytic anemia:Likely due to anemia of chronic disease.Hbstable today at 7.3 (12/3).Will continue Vitamin B12 and Folate supplementation per below.Will recheckCBC tomorrow morning.  1.Continuevitamin B12 1000 mcg once weekly for 4 weeks. 2.Continue1 mg folate once dailyindefinitely 3. Follow-up AM CBC  Euvolemic Hyponatremia:Stable. Likely secondary tomalnutrition. 1.Continue to monitor  Malnutrition: 1. Continue nutritional supplement  Asymptomatic penile discharge: Likely physiologic.  1. Continue to monitor.  CODE STATUS: DNR with full scope treatment.  Dispo: Anticipated discharge pending SNF placement. Due to lack of insurance social work is having difficulty finding placement.   Carroll Sage, MD 10/03/2018, 8:51 AM Pager: [phone]

## 2018-10-04 DIAGNOSIS — Z7401 Bed confinement status: Secondary | ICD-10-CM

## 2018-10-04 LAB — BASIC METABOLIC PANEL
Anion gap: 13 (ref 5–15)
BUN: 36 mg/dL — ABNORMAL HIGH (ref 8–23)
CHLORIDE: 94 mmol/L — AB (ref 98–111)
CO2: 27 mmol/L (ref 22–32)
Calcium: 9.4 mg/dL (ref 8.9–10.3)
Creatinine, Ser: 0.9 mg/dL (ref 0.61–1.24)
GFR calc non Af Amer: >60 mL/min (ref >60)
Glucose, Bld: 104 mg/dL — ABNORMAL HIGH (ref 70–99)
Potassium: 4.8 mmol/L (ref 3.5–5.1)
SODIUM: 134 mmol/L — AB (ref 135–145)

## 2018-10-04 LAB — CBC WITH DIFFERENTIAL/PLATELET
BAND NEUTROPHILS: 0 %
BASOS ABS: 0.1 10*3/uL (ref 0.0–0.1)
BLASTS: 0 %
Basophils Relative: 1 %
EOS ABS: 0.3 10*3/uL (ref 0.0–0.5)
Eosinophils Relative: 2 %
HEMATOCRIT: 24.1 % — AB (ref 39.0–52.0)
HEMOGLOBIN: 7.8 g/dL — AB (ref 13.0–17.0)
LYMPHS PCT: 14 %
Lymphs Abs: 2 10*3/uL (ref 0.7–4.0)
MCH: 28.1 pg (ref 26.0–34.0)
MCHC: 32.4 g/dL (ref 30.0–36.0)
MCV: 86.7 fL (ref 80.0–100.0)
METAMYELOCYTES PCT: 0 %
MONOS PCT: 10 %
Monocytes Absolute: 1.4 10*3/uL — ABNORMAL HIGH (ref 0.1–1.0)
Myelocytes: 0 %
NEUTROS ABS: 10.2 10*3/uL — AB (ref 1.7–7.7)
NRBC: 1.4 % — AB (ref 0.0–0.2)
Neutrophils Relative %: 73 %
OTHER: 0 %
PROMYELOCYTES RELATIVE: 0 %
Platelets: 122 10*3/uL — ABNORMAL LOW (ref 150–400)
RBC: 2.78 MIL/uL — AB (ref 4.22–5.81)
RDW: 15.6 % — ABNORMAL HIGH (ref 11.5–15.5)
WBC: 14 10*3/uL — AB (ref 4.0–10.5)
nRBC: 0 /100 WBC

## 2018-10-04 NOTE — Progress Notes (Signed)
  Plastic Surgery POD# 22 excision left neck lesion application Integra  Notes less drainage from wound  Temp:  [98.8 F (37.1 C)-99.1 F (37.3 C)] 98.8 F (37.1 C) (12/06 1430) Pulse Rate:  [97-115] 105 (12/06 1430) Resp:  [16-17] 16 (12/06 1430) BP: (90-115)/(63-76) 107/74 (12/06 1430) SpO2:  [95 %-98 %] 98 % (12/06 1430) Weight:  [55.3 kg] 55.3 kg (12/06 0629)    PE: Dressing just changed by staff, Wound 100% granulated, silver alginate in place  A/P Continue silver alginate daily to try to control moisture  [name], MD [facility] Plastic & Reconstructive Surgery [phone], pin [phone]

## 2018-10-04 NOTE — Progress Notes (Signed)
   Subjective: Mr. Hewes not have any acute complaints.  He states that he only served in the [employer] during [place] and shortly afterwards.  He is not eligible for [employer].  He also states that "cancer does not bother him" and he would like to continue to increase his strength while garnering support from within his self as well as support groups.  Additionally he states that he did not work with physical therapy because he is in a lot of pain.  He does believe that the pain medications that we are giving him is helping.  He states that he will try and work with physical therapy tomorrow.  Objective:  Vital signs in last 24 hours: Vitals:   10/03/18 1008 10/03/18 1228 10/03/18 2206 10/04/18 0614  BP: 103/67 110/66 115/70 90/63  Pulse: 97 98 (!) 115 (!) 104  Resp:  20  17  Temp:  98.5 F (36.9 C) 99.1 F (37.3 C) 98.8 F (37.1 C)  TempSrc:  Oral Oral Oral  SpO2:  97% 95% 95%  Weight:      Height:       Physical Exam  Constitutional:  Ill-appearing male lying in bed in no acute distress.  Neurological: He is alert.  Psychiatric: He has a normal mood and affect. His behavior is normal.    Assessment/Plan:  Principal Problem:   Metastatic squamous cell carcinoma (HCC) Active Problems:   Smoking   Hypercalcemia   Pulmonary nodules/lesions, multiple   Protein-calorie malnutrition, severe   Constipation   Anemia of chronic disease   Pressure injury of skin  Mr.Gamachehas poorly differentiatedSCCof the neck with metastasisto the lungs and bone s/p wound excision. He is currently awaiting SNF placement.We will continue to work with him to establish his goals of care prior to discharge.  Metastatic squamous cell carcinoma: Stable. Will follow-up with oncology as an outpatient.   Pathologic fracture at C2:MRI showed widespread bone metastasis throughout cervical spine with multiple associated pathologic fractures.Neurosurgery does not recommend surgery or  c-collar at this time. 1. Increase Oxy IR to 10 mg every 6 hours PRN 2. Continue Toradol 30 mg every 6 hours PRN  3. Continue bowel regimen with miralax and senokot  4. Continue PT  Urinary Retention: Likely secondary to opioid use.Coudeplaced 09/26/18. He will need this in place for at least 1 month due to potential stretch injury 2/2acute urinary retention.He will also need to follow-up with urology as an outpatient prior to removing coud catheter. 1. Continue to monitor  Normocytic anemia:Likely due to anemia of chronic disease.Hbstable today at 7.8.Will continue Vitamin B12 and Folate supplementation per below.Will recheckCBC tomorrow morning.  1.Continuevitamin B12 1000 mcg once weekly for 4 weeks. 2.Continue1 mg folate once dailyindefinitely 3. Follow-up AM CBC  Euvolemic Hyponatremia:Stable.Likely secondary tomalnutrition. 1.Continue to monitor  Malnutrition: 1. Continue nutritional supplement  Asymptomatic penile discharge: Likely physiologic.  1. Continue to monitor.  CODE STATUS: DNR with full scope treatment.  Dispo: Anticipated discharge pending SNF placement. Due to lack of insurance social work is having difficulty finding placement.   Carroll Sage, MD 10/04/2018, 6:21 AM Pager: [phone]

## 2018-10-05 MED ORDER — APIXABAN 2.5 MG PO TABS
2.5000 mg | ORAL_TABLET | Freq: Two times a day (BID) | ORAL | Status: DC
  Administered 2018-10-05 – 2018-10-17 (×25): 2.5 mg via ORAL
  Filled 2018-10-05 (×25): qty 1

## 2018-10-05 NOTE — Progress Notes (Signed)
   Subjective: No acute events overnight.  He does state that he is getting frustrated with the amount of people coming in and out of his room.  He is otherwise doing well and denies any pain this morning.  Objective:  Vital signs in last 24 hours: Vitals:   10/04/18 1106 10/04/18 1430 10/04/18 2053 10/05/18 0530  BP: 99/76 107/74 105/70 108/70  Pulse: 97 (!) 105 (!) 103 (!) 104  Resp:  16 19 12   Temp:  98.8 F (37.1 C) 99.1 F (37.3 C) 99.5 F (37.5 C)  TempSrc:  Oral Oral Oral  SpO2:  98% 97% 96%  Weight:      Height:       Physical Exam  Constitutional:  Thin appearing male lying in bed in no acute distress.  Neurological: He is alert.  Psychiatric: He has a normal mood and affect. His behavior is normal.    Assessment/Plan:  Principal Problem:   Metastatic squamous cell carcinoma (HCC) Active Problems:   Smoking   Hypercalcemia   Pulmonary nodules/lesions, multiple   Protein-calorie malnutrition, severe   Constipation   Anemia of chronic disease   Pressure injury of skin  Mr.Gamachehas poorly differentiatedSCCof the neck with metastasisto the lungs and bone s/p wound excision.He is currently awaiting SNF placement.We will continue to work with him to establish his goals of care prior to discharge and will likely reconsult palliative care next week.  Metastatic squamous cell carcinoma: Stable. Will follow-up with oncology as an outpatient.   Pathologic fracture at C2:MRI showed widespread bone metastasis throughout cervical spine with multiple associated pathologic fractures.Neurosurgery does not recommend surgery or c-collar at this time. 1.IncreaseOxy IRto 10mg  every 6 hours PRN 2. ContinueToradol 30 mg every 6 hours PRN 3. Continue bowel regimen with miralax and senokot 4. Continue PT  Urinary Retention: Likely secondary to opioid use.Coudeplaced 09/26/18. He will need this in place for at least 1 month due to potential stretch injury  2/2acute urinary retention.He will also need to follow-up with urology as an outpatient prior to removing coud catheter. 1. Continue to monitor  Normocytic anemia:Likely due to anemia of chronic disease.Hbhas remained stable.Will continue Vitamin B12 and Folate supplementation per below.Will recheckCBCin several days. 1.Continuevitamin B12 1000 mcg once weekly for 4 weeks. 2.Continue1 mg folate once dailyindefinitely  Euvolemic Hyponatremia:Stable.Likely secondary tomalnutrition. 1.Continue to monitor  Malnutrition: 1. Continue nutritional supplement  Asymptomatic penile discharge: Likely physiologic.  1. Continue to monitor.  CODE STATUS: DNR with full scope treatment.  Dispo: Anticipated dischargepending SNF placement. Due to lack of insurance social work is having difficulty finding placement.  Carroll Sage, MD 10/05/2018, 6:32 AM Pager: [phone]

## 2018-10-06 MED ORDER — LIP MEDEX EX OINT
TOPICAL_OINTMENT | CUTANEOUS | Status: DC | PRN
  Filled 2018-10-06: qty 7

## 2018-10-06 NOTE — Progress Notes (Signed)
   Subjective: No acute events overnight.  No cp or SOB, sense of humor intact.    Objective:  Vital signs in last 24 hours: Vitals:   10/05/18 0929 10/05/18 1322 10/05/18 2104 10/06/18 0510  BP: 105/63 108/64 117/73 112/77  Pulse: 99 95 (!) 101 92  Resp:  18 16 18   Temp:  97.6 F (36.4 C) 99.1 F (37.3 C) 97.8 F (36.6 C)  TempSrc:   Oral   SpO2:  98% 97% 97%  Weight:    55 kg  Height:       Cardiac: normal rate and rhythm, clear s1 and s2, no murmurs, rubs or gallops Pulmonary: CTAB, not in distress Extremities: no LE edema Psych: Alert, conversant, in good spirits  Assessment/Plan:  Principal Problem:   Metastatic squamous cell carcinoma (HCC) Active Problems:   Smoking   Hypercalcemia   Pulmonary nodules/lesions, multiple   Protein-calorie malnutrition, severe   Constipation   Anemia of chronic disease   Pressure injury of skin  Mr.Gamachehas poorly differentiatedSCCof the neck with metastasisto the lungs and bone s/p wound excision.He is currently awaiting SNF placement.We will continue to work with him to establish his goals of care prior to discharge and will likely reconsult palliative care next week.  Metastatic squamous cell carcinoma: Stable. Will follow-up with oncology as an outpatient.   Pathologic fracture at C2:MRI showed widespread bone metastasis throughout cervical spine with multiple associated pathologic fractures.Neurosurgery does not recommend surgery or c-collar at this time. 1 continueOxy IRto 10mg  every 6 hours PRN 2. Continue PT 3. Continue bowel regimen with miralax and senokot   Urinary Retention: Likely secondary to opioid use.Coudeplaced 09/26/18. He will need this in place for at least 1 month due to potential stretch injury 2/2acute urinary retention.He will also need to follow-up with urology as an outpatient prior to removing coud catheter. 1. Continue to monitor   Normocytic anemia:Likely due to anemia  of chronic disease.Hbhas remained stable.Will continue Vitamin B12 and Folate supplementation per below.Will recheckCBCin several days. 1.Continuevitamin B12 1000 mcg once weekly for 4 weeks. 2.Continue1 mg folate once dailyindefinitely  Euvolemic Hyponatremia:Stable.Likely secondary tomalnutrition. 1.Continue to monitor  Malnutrition: 1. Continue nutritional supplement  Asymptomatic penile discharge: Likely physiologic.  1. Continue to monitor.  CODE STATUS: DNR with full scope treatment.  Dispo: Anticipated dischargepending SNF placement. Due to lack of insurance social work is having difficulty finding placement.  Katherine Roan, MD 10/06/2018, 8:42 AM

## 2018-10-06 NOTE — Progress Notes (Signed)
Wound care was done on the patient's neck.

## 2018-10-07 LAB — CBC
HEMATOCRIT: 22.2 % — AB (ref 39.0–52.0)
HEMOGLOBIN: 7.1 g/dL — AB (ref 13.0–17.0)
MCH: 27.6 pg (ref 26.0–34.0)
MCHC: 32 g/dL (ref 30.0–36.0)
MCV: 86.4 fL (ref 80.0–100.0)
Platelets: 120 10*3/uL — ABNORMAL LOW (ref 150–400)
RBC: 2.57 MIL/uL — ABNORMAL LOW (ref 4.22–5.81)
RDW: 15.9 % — AB (ref 11.5–15.5)
WBC: 14.9 10*3/uL — ABNORMAL HIGH (ref 4.0–10.5)
nRBC: 2.6 % — ABNORMAL HIGH (ref 0.0–0.2)

## 2018-10-07 NOTE — Progress Notes (Signed)
Patient remains on Difficult to Place waitlist. [place] to see the patient tomorrow at 2pm for Disability application.   Percell Locus [place] LCSW [phone]

## 2018-10-07 NOTE — Progress Notes (Signed)
   Subjective: Austin Payne denies any acute complaints.  He states that he walked around the hallway for around 20 minutes and afterwards became very short of breath for significant amount of time.  He also states that his appetite has been very low and he has only been able to drink 3-4 ensures a day as well as daily coffee.  He understands that he has a very late stage cancer but did not understand why he was becoming so weak.  I explained to him the extent of his cancer and that this was causing his weakness.  His goals include getting better so that he can get a part-time job, buy a home, go to concerts, etc.  Objective:  Vital signs in last 24 hours: Vitals:   10/06/18 0510 10/06/18 1704 10/06/18 2040 10/07/18 0605  BP: 112/77 113/82 110/76 109/73  Pulse: 92 98 (!) 105 (!) 101  Resp: 18 20 18    Temp: 97.8 F (36.6 C) 97.9 F (36.6 C) 98.3 F (36.8 C) 98.7 F (37.1 C)  TempSrc:  Oral    SpO2: 97% 97% 94% 95%  Weight: 55 kg     Height:       Physical Exam  Constitutional:  Ill-appearing male lying in bed in no acute distress.  Neurological: He is alert.  Psychiatric: He has a normal mood and affect. His behavior is normal.  Nursing note and vitals reviewed.   Assessment/Plan:  Principal Problem:   Metastatic squamous cell carcinoma (HCC) Active Problems:   Smoking   Hypercalcemia   Pulmonary nodules/lesions, multiple   Protein-calorie malnutrition, severe   Constipation   Anemia of chronic disease   Pressure injury of skin   [name] poorly differentiatedSCCof the neck with metastasisto the lungs and bone s/p wound excision.He is currently awaiting SNF placement.I do believe that he has very unrealistic perceptions of his cancer as well as prognosis.  I do think that he would benefit from speaking to palliative care again.  We will continue work with him to establish goals of care prior to discharge.  Metastatic squamous cell carcinoma: Stable. Will  follow-up with oncology as an outpatient. 1.  Reconsult palliative care   Pathologic fracture at C2:MRI showed widespread bone metastasis throughout cervical spine with multiple associated pathologic fractures.Neurosurgery does not recommend surgery or c-collar at this time. 1 continueOxy IRto 10mg  every 6 hours PRN 2. Continue PT 3. Continue bowel regimen with miralax and senokot  Urinary Retention: Likely secondary to opioid use.Coudeplaced 09/26/18. He will need this in place for at least 1 month due to potential stretch injury 2/2acute urinary retention.He will also need to follow-up with urology as an outpatient prior to removing coud catheter. 1. Continue to monitor   Normocytic anemia:Likely due to anemia of chronic disease.Hbhas remained stable but low, today 7.1.Will continue Vitamin B12 and Folate supplementation per below.Will recheckCBCin several days. 1.Continuevitamin B12 1000 mcg once weekly for 4 weeks. 2.Continue1 mg folate once dailyindefinitely  Euvolemic Hyponatremia:Stable.Likely secondary tomalnutrition. 1.Continue to monitor  Malnutrition: 1. Continue nutritional supplement  Asymptomatic penile discharge: Likely physiologic.  1. Continue to monitor.  CODE STATUS: DNR with full scope treatment. DVT PPX: Refusing Lovenox.  Switch to apixaban 2.5 mg twice daily.  Dispo: Anticipated dischargepending SNF placement. Due to lack of insurance social work is having difficulty finding placement.  Carroll Sage, MD 10/07/2018, 7:07 AM Pager: [phone]

## 2018-10-07 NOTE — Progress Notes (Signed)
  Plastic Surgery POD# 25 excision left neck lesion application Integra  No events, states dressing just changed  Temp:  [97.9 F (36.6 C)-98.7 F (37.1 C)] 98.7 F (37.1 C) (12/09 0605) Pulse Rate:  [98-105] 101 (12/09 0605) Resp:  [18-20] 18 (12/08 2040) BP: (109-113)/(73-82) 109/73 (12/09 0605) SpO2:  [94 %-97 %] 95 % (12/09 6811)    PE: Small drainage and slough over wound Wound 100% granulated, silver alginate in place  A/P Continue silver alginate daily to try to control moisture Patient would like to shave- ok to do this around wound, counseled patient to ask staff for supplies, assist  [name], MD [facility] Plastic & Reconstructive Surgery [phone], pin [phone]
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
## 2018-10-08 DIAGNOSIS — C760 Malignant neoplasm of head, face and neck: Secondary | ICD-10-CM

## 2018-10-08 LAB — SOLUBLE TRANSFERRIN RECEPTOR: Transferrin Receptor: 25 nmol/L (ref 12.2–27.3)

## 2018-10-08 NOTE — Progress Notes (Signed)
PT Cancellation Note  Patient Details Name: Austin Payne MRN: [id number] DOB: [birth year]   Cancelled Treatment:    Reason Eval/Treat Not Completed: Pain limiting ability to participate Pt refused until he was given pain medication for his LBP. Will follow up per plan of care.   Valerie Roys 10/08/2018, 1:58 PM
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
## 2018-10-08 NOTE — Progress Notes (Signed)
   Subjective: Patient reports no new pain or discomfort.  His goals today are to get up and walk around the unit some today.    Objective:  Vital signs in last 24 hours: Vitals:   10/07/18 1425 10/07/18 2114 10/08/18 0531 10/08/18 0611  BP: 102/63 105/73 108/62   Pulse: 99 100 (!) 101   Resp: 18     Temp: 98.2 F (36.8 C) 98.2 F (36.8 C) 98.6 F (37 C)   TempSrc: Oral Oral    SpO2: 97% 96% 94%   Weight:    55.9 kg  Height:       Physical Exam  Constitutional:  Thin, ill-appearing male lying in bed in no acute distress.  Neurological: He is alert.  Psychiatric: He has a normal mood and affect. His behavior is normal.  Nursing note and vitals reviewed.   Assessment/Plan:  Principal Problem:   Metastatic squamous cell carcinoma (HCC) Active Problems:   Smoking   Hypercalcemia   Pulmonary nodules/lesions, multiple   Protein-calorie malnutrition, severe   Constipation   Anemia of chronic disease   Pressure injury of skin  Mr.Gamachehas poorly differentiatedSCCof the neck with metastasisto the lungs and bone s/p wound excision.He is currently awaiting SNF placement.I have consulted palliative care to reestablish goals in the setting of cancer with a poor prognosis.  Metastatic squamous cell carcinoma: Stable. Will follow-up with oncology as an outpatient. 1.    Follow-up palliative care recommendations   Pathologic fracture at C2:MRI showed widespread bone metastasis throughout cervical spine with multiple associated pathologic fractures.Neurosurgery does not recommend surgery or c-collar at this time. 1continueOxy IR 10mg  every 6 hours PRN 2. Continue PT 3. Continue bowel regimen with miralax and senokot  Urinary Retention: Likely secondary to opioid use.Coudeplaced 09/26/18. He will need this in place for at least 1 month due to potential stretch injury 2/2acute urinary retention.He will also need to follow-up with urology as an outpatient  prior to removing coud catheter. 1. Continue to monitor   Normocytic anemia:Likely due to anemia of chronic disease.Hbhas remained stable but low, yesterday at 7.1.Will continue Vitamin B12 and Folate supplementation per below.Will recheckCBCtomorrow. 1.Continuevitamin B12 1000 mcg once weekly for 4 weeks. 2.Continue1 mg folate once dailyindefinitely 3. Follow-up CBC results  Euvolemic Hyponatremia:Stable.Likely secondary tomalnutrition. 1.Continue to monitor  Malnutrition: 1. Continue nutritional supplement  CODE STATUS: DNR with full scope treatment. DVT PPX: Refused Lovenox.    Now on apixaban 2.5 mg twice daily.  Dispo: Anticipated dischargepending SNF placement. Due to lack of insurance social work is having difficulty finding placement.  Carroll Sage, MD 10/08/2018, 6:51 AM Pager: [phone]

## 2018-10-09 LAB — CBC
HCT: 20.7 % — ABNORMAL LOW (ref 39.0–52.0)
Hemoglobin: 6.8 g/dL — CL (ref 13.0–17.0)
MCH: 28.3 pg (ref 26.0–34.0)
MCHC: 32.9 g/dL (ref 30.0–36.0)
MCV: 86.3 fL (ref 80.0–100.0)
PLATELETS: 123 10*3/uL — AB (ref 150–400)
RBC: 2.4 MIL/uL — ABNORMAL LOW (ref 4.22–5.81)
RDW: 16.3 % — AB (ref 11.5–15.5)
WBC: 14 10*3/uL — AB (ref 4.0–10.5)
nRBC: 2.2 % — ABNORMAL HIGH (ref 0.0–0.2)

## 2018-10-09 LAB — HEMOGLOBIN AND HEMATOCRIT, BLOOD
HCT: 25.2 % — ABNORMAL LOW (ref 39.0–52.0)
Hemoglobin: 8.3 g/dL — ABNORMAL LOW (ref 13.0–17.0)

## 2018-10-09 LAB — PREPARE RBC (CROSSMATCH)

## 2018-10-09 MED ORDER — SODIUM CHLORIDE 0.9% IV SOLUTION
Freq: Once | INTRAVENOUS | Status: AC
  Administered 2018-10-15: 06:00:00 via INTRAVENOUS

## 2018-10-09 NOTE — Progress Notes (Signed)
OT Cancellation Note  Patient Details Name: Austin Payne MRN: [id number] DOB: 05/26/1956   Cancelled Treatment:    Reason Eval/Treat Not Completed: Patient declined, no reason specified. Checked with RN, and she stating pt is good for OOB to chair. Upon arrival, pt supine in bed and eyes closed. Pt stating "Today is not the day for this. I am --- dying. I --- don't care anymore. And I want to be left the --- alone." Pt requesting a cold spite with ice, which was provided. Will return as schedule allows.   [place], OTR/L Acute Rehab Pager: [phone] Office: [phone] 10/09/2018, 11:27 AM

## 2018-10-09 NOTE — Progress Notes (Signed)
   Subjective:   Mr. Delcarlo was examined at bedside this morning and reports a good appetite. He worked with physical therapy yesterday but perseverates that he will be able to ambulate by himself with the help of nursing staff and want physical therapy discontinued. He requested for assistance for personal hygiene. We made him aware of his anemia and the plan for transfusion today. We also presented to him an option of working up to pin-point the source of bleed and he denied stating "Its a waste of time." He states he is comfortable with intermittent blood transfusion. He indicates of his desire to go back on the streets and to [place] the woods."    Objective:  Vital signs in last 24 hours: Vitals:   10/08/18 0611 10/08/18 1308 10/08/18 2135 10/09/18 0529  BP:  115/71 110/69 109/64  Pulse:  (!) 108 (!) 116 (!) 106  Resp:   16   Temp:   98.2 F (36.8 C)   TempSrc:   Oral   SpO2:  96% 95% 96%  Weight: 55.9 kg     Height:       Physical Exam  Constitutional:  Thin appearing male lying in bed in no acute distress.  Neurological: He is alert.  Psychiatric: He has a normal mood and affect. His behavior is normal.    Assessment/Plan:  Principal Problem:   Metastatic squamous cell carcinoma (HCC) Active Problems:   Smoking   Hypercalcemia   Pulmonary nodules/lesions, multiple   Protein-calorie malnutrition, severe   Constipation   Anemia of chronic disease   Pressure injury of skin  Mr.Gamachehas poorly differentiatedSCCof the neck with metastasisto the lungs and bone s/p wound excision.He does not wish to participate in physical therapy/Occupational Therapy.  I have asked the services to not see him anymore.  We will continue to provide symptom management while awaiting SNF placement. I have consulted palliative care to reestablish goals in the setting of cancer with a poor prognosis.  Metastatic squamous cell carcinoma: Stable. 1. Follow-up palliative care  recommendations   Pathologic fracture at C2:MRI showed widespread bone metastasis throughout cervical spine with multiple associated pathologic fractures.Neurosurgery does not recommend surgery or c-collar at this time. 1. ContinueOxy IR 10mg  every 6 hours PRN 2. Continue PT 3. Continue bowel regimen with miralax and senokot  Urinary Retention: Likely secondary to opioid use.Coudeplaced 09/26/18. He will need this in place for at least 1 month due to potential stretch injury 2/2acute urinary retention.He will also need to follow-up with urology as an outpatient prior to removing coud catheter. 1. Continue to monitor   Normocytic anemia:Likely due to anemia of chronic disease.Hbdecreased today to 6.8. Will transfer 1 unit pRBC. Will also continue Vitamin B12 and Folate supplementation per below.Will recheckCBCtomorrow.  Mr. Fontaine does not want to seek further diagnostic testing including endoscopy/colonoscopy but does wish to continue with periodic blood transfusions. 1.Continuevitamin B12 1000 mcg once weekly for 4 weeks. 2.Continue1 mg folate once dailyindefinitely 3. Follow-up post-transfusion CBC  Malnutrition: 1. Continue nutritional supplement  CODE STATUS: DNR with full scope treatment. DVT YIR:SWNIOEV Lovenox.Now onapixaban 2.5 mg twice daily.  Dispo: Anticipated dischargepending SNF placement. Due to lack of insurance social work is having difficulty finding placement.  Carroll Sage, MD 10/09/2018, 6:39 AM Pager: [phone]

## 2018-10-09 NOTE — Progress Notes (Signed)
CRITICAL VALUE ALERT  Critical Value:  Hgb 6.8  Date & Time Notied: 10/09/18 0625  Provider Notified: Dr Laurena Bering  Orders Received/Actions taken: MD notified.

## 2018-10-10 DIAGNOSIS — R5381 Other malaise: Secondary | ICD-10-CM | POA: Diagnosis not present

## 2018-10-10 LAB — TYPE AND SCREEN
ABO/RH(D): B NEG
Antibody Screen: NEGATIVE
Unit division: 0

## 2018-10-10 LAB — BPAM RBC
Blood Product Expiration Date: 201912262359
ISSUE DATE / TIME: 201912111010
Unit Type and Rh: 1700

## 2018-10-10 NOTE — Progress Notes (Signed)
   Subjective: Mr. Nole is lying in bed with his eyes closed.  He states that he is having low back pain and lower extremity pain but does believe that the pain medicine that he is getting is helping.  He does not particularly want to talk this morning and would like to go back to sleep.  Objective:  Vital signs in last 24 hours: Vitals:   10/09/18 1257 10/09/18 1551 10/09/18 2006 10/10/18 0522  BP: 107/68 117/69 104/65 96/67  Pulse: 89 100 98 (!) 105  Resp: 16   18  Temp: 98.1 F (36.7 C) (!) 97.5 F (36.4 C) 97.7 F (36.5 C) 98.5 F (36.9 C)  TempSrc: Oral Oral Oral Oral  SpO2:  97% 95% 96%  Weight:      Height:       Physical Exam Constitutional:      Comments: Ill-appearing male lying in bed in no acute distress with eyes closed.  Neurological:     Mental Status: He is alert.  Psychiatric:     Comments: He has a very blunted affect this morning.     Assessment/Plan:  Principal Problem:   Metastatic squamous cell carcinoma (HCC) Active Problems:   Smoking   Hypercalcemia   Pulmonary nodules/lesions, multiple   Protein-calorie malnutrition, severe   Constipation   Anemia of chronic disease   Pressure injury of skin  Mr.Gamachehas poorly differentiatedSCCof the neck with metastasisto the lungs and bone s/p wound excision. We will continue to provide symptom management while awaiting SNF placement. I have touched base with palliative care to reestablish goals in the setting of cancer with a poor prognosis.  They state that they will see him as soon as possible.  I appreciate their help and will follow-up recommendations.  Metastatic squamous cell carcinoma: Stable. 1.Follow-uppalliative carerecommendations   Pathologic fracture at C2:MRI showed widespread bone metastasis throughout cervical spine with multiple associated pathologic fractures.Neurosurgery does not recommend surgery or c-collar at this time. 1. ContinueOxy IR 10mg  every 6 hours  PRN 2. Continue PT 3. Continue bowel regimen with miralax and senokot  Urinary Retention: Likely secondary to opioid use.Coudeplaced 09/26/18. He will need this in place for at least 1 month due to potential stretch injury 2/2acute urinary retention.He will also need to follow-up with urology as an outpatient prior to removing coud catheter. 1. Continue to monitor   Normocytic anemia:Likely due to anemia of chronic disease. Posttransfusion hemoglobin 8.3 (up from 6.8) after 1 unit PRBC. Will continue Vitamin B12 and Folate supplementation per below.Mr. Wurzer does not want to seek further diagnostic testing including endoscopy/colonoscopy but does wish to continue with periodic blood transfusions. 1.Continuevitamin B12 1000 mcg once weekly for 4 weeks. 2.Continue1 mg folate once dailyindefinitely.  Malnutrition: 1. Continue nutritional supplement  CODE STATUS: DNR with full scope treatment. DVT QGB:[id number].Now onapixaban 2.5 mg twice daily.  Dispo: Anticipated dischargepending SNF placement. Due to lack of insurance social work is having difficulty finding placement.  Carroll Sage, MD 10/10/2018, 11:35 AM Pager: [phone]

## 2018-10-11 DIAGNOSIS — Z7189 Other specified counseling: Secondary | ICD-10-CM

## 2018-10-11 DIAGNOSIS — Z515 Encounter for palliative care: Secondary | ICD-10-CM

## 2018-10-11 MED ORDER — OXYCODONE HCL 5 MG PO TABS
10.0000 mg | ORAL_TABLET | ORAL | Status: DC | PRN
  Administered 2018-10-11 – 2018-10-12 (×4): 10 mg via ORAL
  Filled 2018-10-11 (×4): qty 2

## 2018-10-11 MED ORDER — OXYCODONE HCL 5 MG PO TABS: 10.0000 mg | ORAL_TABLET | ORAL | Status: DC | PRN

## 2018-10-11 NOTE — Consult Note (Addendum)
Plastic Surgery  POD# 29 excision left neck lesion application Integra  No events, states dressing was changed 2 hours ago.  Temp:  [98.2 F (36.8 C)-100.2 F (37.9 C)] 98.2 F (36.8 C) (12/13 1335) Pulse Rate:  [109-121] 109 (12/13 1335) Resp:  [16-18] 16 (12/13 1335) BP: (99-110)/(60-77) 99/60 (12/13 1335) SpO2:  [95 %-97 %] 95 % (12/13 1335)  PE: Left neck wound: little drainage. Large amount of slough. Wound 100% granulated, silver alginate in place.  A/P Continue silver alginate daily to try to control moisture.  Caren Macadam PA-C Plastic Surgery  Patient seen and examined. Agree with above. No new wound issues.  [name], MD [facility] Plastic & Reconstructive Surgery [phone], pin [phone]

## 2018-10-11 NOTE — Progress Notes (Signed)
CSW sent referral out again to SNFs to see if it helps patient to have Medicaid approved.   Austin Locus Pailyn [place] LCSW [phone]
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
## 2018-10-11 NOTE — Progress Notes (Signed)
   Subjective: Mr. Marcello Moores states that he continues to have lower extremity pain.  He does state that the oxycodone is helping but wears off very quickly.  He has had multiple bowel movements per day.  He also would like to shave today and I have asked his nurse tech to bring him a razor.  He is otherwise doing well.  Objective:  Vital signs in last 24 hours: Vitals:   10/09/18 2006 10/10/18 0522 10/10/18 1350 10/10/18 2247  BP: 104/65 96/67 111/65 108/69  Pulse: 98 (!) 105 (!) 113 (!) 120  Resp:  18 18 17   Temp: 97.7 F (36.5 C) 98.5 F (36.9 C)  100.2 F (37.9 C)  TempSrc: Oral Oral  Oral  SpO2: 95% 96% 94% 96%  Weight:      Height:       Physical Exam Constitutional:      Comments: Ill-appearing male lying in bed in no acute distress.  Neurological:     Mental Status: He is alert.  Psychiatric:        Mood and Affect: Mood and affect normal.        Behavior: Behavior normal.     Assessment/Plan:  Principal Problem:   Metastatic squamous cell carcinoma (HCC) Active Problems:   Protein-calorie malnutrition, severe   Anemia of chronic disease   Pressure injury of skin   Physical deconditioning   Mr.Gamachehas poorly differentiatedSCCof the neck with metastasisto the lungs and bone s/p wound excision.We will continue to provide symptom management while awaiting SNF placement. I touched base with palliative care yesterday to reestablish goals in the setting of cancer with a poor prognosis.    They have agreed to see him as soon as possible.  I appreciate their help and will follow-up recommendations.  Metastatic squamous cell carcinoma: Stable. 1.Follow-uppalliative carerecommendations   Pathologic fracture at C2:MRI showed widespread bone metastasis throughout cervical spine with multiple associated pathologic fractures.Neurosurgery does not recommend surgery or c-collar at this time. 1. Increase frequency ofOxy IR 10mg  every 4 hours PRN 2. Continue  bowel regimen with miralax and senokot  Urinary Retention: Likely secondary to opioid use.Coudeplaced 09/26/18. He will need this in place for at least 1 month due to potential stretch injury 2/2acute urinary retention.He will also need to follow-up with urology as an outpatient prior to removing coud catheter. 1. Continue to monitor   Normocytic anemia:Likely due to anemia of chronic disease. Willcontinue Vitamin B12 and Folate supplementation per below.Mr. Poyer does not want to seek further diagnostic testing including endoscopy/colonoscopy but does wish to continue with periodic blood transfusions. 1.Continuevitamin B12 1000 mcg once weekly for 4 weeks. 2.Continue1 mg folate once dailyindefinitely. 3.  Repeat CBC in several days  Malnutrition: 1. Continue nutritional supplement  CODE STATUS: DNR with full scope treatment. DVT ZOX:[id number].Now onapixaban 2.5 mg twice daily.  Dispo: Anticipated dischargepending SNF placement. Due to lack of insurance social work is having difficulty finding placement.  Carroll Sage, MD 10/11/2018, 6:50 AM Pager: [phone]

## 2018-10-12 DIAGNOSIS — C4442 Squamous cell carcinoma of skin of scalp and neck: Principal | ICD-10-CM

## 2018-10-12 DIAGNOSIS — Z66 Do not resuscitate: Secondary | ICD-10-CM

## 2018-10-12 DIAGNOSIS — R339 Retention of urine, unspecified: Secondary | ICD-10-CM

## 2018-10-12 LAB — PHOSPHORUS: PHOSPHORUS: 4.3 mg/dL (ref 2.5–4.6)

## 2018-10-12 LAB — CBC WITH DIFFERENTIAL/PLATELET
Abs Immature Granulocytes: 0.9 10*3/uL — ABNORMAL HIGH (ref 0.00–0.07)
Band Neutrophils: 5 %
Basophils Absolute: 0.1 10*3/uL (ref 0.0–0.1)
Basophils Relative: 1 %
Eosinophils Absolute: 0 10*3/uL (ref 0.0–0.5)
Eosinophils Relative: 0 %
HCT: 23.1 % — ABNORMAL LOW (ref 39.0–52.0)
Hemoglobin: 7.5 g/dL — ABNORMAL LOW (ref 13.0–17.0)
Lymphocytes Relative: 13 %
Lymphs Abs: 1.1 10*3/uL (ref 0.7–4.0)
MCH: 27.8 pg (ref 26.0–34.0)
MCHC: 32.5 g/dL (ref 30.0–36.0)
MCV: 85.6 fL (ref 80.0–100.0)
Metamyelocytes Relative: 7 %
Monocytes Absolute: 0.6 10*3/uL (ref 0.1–1.0)
Monocytes Relative: 7 %
Myelocytes: 4 %
Neutro Abs: 5.5 10*3/uL (ref 1.7–7.7)
Neutrophils Relative %: 63 %
Platelets: DECREASED 10*3/uL (ref 150–400)
RBC: 2.7 MIL/uL — ABNORMAL LOW (ref 4.22–5.81)
RDW: 16.3 % — ABNORMAL HIGH (ref 11.5–15.5)
WBC: 8.1 10*3/uL (ref 4.0–10.5)
nRBC: 2 /100 WBC — ABNORMAL HIGH
nRBC: 2.7 % — ABNORMAL HIGH (ref 0.0–0.2)

## 2018-10-12 LAB — BASIC METABOLIC PANEL
Anion gap: 14 (ref 5–15)
BUN: 37 mg/dL — ABNORMAL HIGH (ref 8–23)
CO2: 29 mmol/L (ref 22–32)
Calcium: 9.5 mg/dL (ref 8.9–10.3)
Chloride: 91 mmol/L — ABNORMAL LOW (ref 98–111)
Creatinine, Ser: 0.68 mg/dL (ref 0.61–1.24)
GFR calc Af Amer: >60 mL/min (ref >60)
GFR calc non Af Amer: >60 mL/min (ref >60)
Glucose, Bld: 132 mg/dL — ABNORMAL HIGH (ref 70–99)
Potassium: 3.6 mmol/L (ref 3.5–5.1)
Sodium: 134 mmol/L — ABNORMAL LOW (ref 135–145)

## 2018-10-12 LAB — MAGNESIUM: Magnesium: 1.9 mg/dL (ref 1.7–2.4)

## 2018-10-12 MED ORDER — OXYCODONE HCL 5 MG PO TABS: 20.0000 mg | ORAL_TABLET | ORAL | Status: DC | PRN

## 2018-10-12 MED ORDER — OXYCODONE HCL 5 MG PO TABS
15.0000 mg | ORAL_TABLET | ORAL | Status: DC | PRN
  Administered 2018-10-12 – 2018-10-13 (×2): 15 mg via ORAL
  Filled 2018-10-12 (×2): qty 3

## 2018-10-12 NOTE — Progress Notes (Addendum)
   Subjective: Mr. Hirt states that he continues to be in a significant amount of pain this morning.  He describes the pain as being in his lower extremities bilaterally and low back.  He states that he has several bowel movements per day and otherwise does not have any other acute complaints.  Objective:  Vital signs in last 24 hours: Vitals:   10/11/18 1036 10/11/18 1335 10/11/18 2233 10/12/18 0558  BP: 110/77 99/60 112/68 104/68  Pulse: (!) 113 (!) 109 (!) 108 (!) 115  Resp:  16 16 18   Temp:  98.2 F (36.8 C) 99.1 F (37.3 C) 99.1 F (37.3 C)  TempSrc:  Oral  Oral  SpO2:  95% 96% 93%  Weight:      Height:       Physical Exam Constitutional:      Comments: Ill-appearing male lying in bed in no acute distress.  Neurological:     Mental Status: He is alert.  Psychiatric:        Mood and Affect: Mood and affect normal.        Behavior: Behavior normal.    Assessment/Plan:  Principal Problem:   Metastatic squamous cell carcinoma (HCC) Active Problems:   Protein-calorie malnutrition, severe   Anemia of chronic disease   Pressure injury of skin   Physical deconditioning   Goals of care, counseling/discussion   Palliative care encounter  Mr., shows poorly differentiated squamous cell carcinoma of the neck with metastasis to the lungs and bone status post wound resection.  He was seen by palliative care yesterday and elected to remain full scope care.  We will continue symptom management while awaiting SNF placement.  Metastatic squamous cell carcinoma: Stable.  Plan is to see oncology as an outpatient.  Pathologic fracture at C2:MRI showed widespread bone metastasis throughout cervical spine with multiple associated pathologic fractures.Neurosurgery does not recommend surgery or c-collar at this time.  He does continue to have low back pain and bilateral lower extremity edema likely secondary to bony metastasis. 1. Increase dose ofOxy IR to 15mg  every 4 hours PRN 2.  Continue bowel regimen with miralax and senokot  Urinary Retention: Likely secondary to opioid use.Coudeplaced 09/26/18. He will need this in place for at least 1 month due to potential stretch injury 2/2acute urinary retention.He will also need to follow-up with urology as an outpatient prior to removing coud catheter. 1. Continue to monitor   Normocytic anemia:Likely due to anemia of chronic disease. Hemoglobin today at 7.5.  Willcontinue Vitamin B12 and Folate supplementation per below.Mr. Sievers does not want to seek further diagnostic testing including endoscopy/colonoscopy but does wish to continue with periodic blood transfusions. 1.Continuevitamin B12 1000 mcg once weekly for 4 weeks. 2.Continue1 mg folate once dailyindefinitely.  Malnutrition: 1. Continue nutritional supplement  CODE STATUS: DNR with full scope treatment. DVT VVO:[id number].Now onapixaban 2.5 mg twice daily.  Dispo: Anticipated discharge ending SNF placement.  Due to lack of insurance social work is having difficulty finding placement.  Carroll Sage, MD 10/12/2018, 7:17 AM Pager: [phone]

## 2018-10-12 NOTE — Plan of Care (Signed)
Still complaining of pain doing well  feeding  supplement   as prescribed will continue to monitor has three wound  stage2 at the buttocks pink foam dressing applied this morning

## 2018-10-13 MED ORDER — OXYCODONE HCL 5 MG PO TABS
15.0000 mg | ORAL_TABLET | Freq: Four times a day (QID) | ORAL | Status: DC
  Administered 2018-10-13 – 2018-10-17 (×14): 15 mg via ORAL
  Filled 2018-10-13 (×14): qty 3

## 2018-10-13 NOTE — Progress Notes (Signed)
   Subjective: Austin Payne states that he continues to have low back pain and lower extremity pain.  He reports that he has called the nurses multiple times but unfortunately has not received pain medications in a timely manner.  He does not have any other acute complaints.  Objective:  Vital signs in last 24 hours: Vitals:   10/12/18 1100 10/12/18 1438 10/12/18 2149 10/13/18 0531  BP: (!) 96/51 96/60 106/77 113/69  Pulse: (!) 105 (!) 108 92 (!) 104  Resp:  17 12 16   Temp:  98.3 F (36.8 C) 97.9 F (36.6 C) 98.8 F (37.1 C)  TempSrc:  Oral Oral Oral  SpO2:  93% 95% 93%  Weight:    56.1 kg  Height:       Physical Exam Constitutional:      Comments: Thin ill-appearing male lying in bed in no acute distress.  Neurological:     Mental Status: He is alert.  Psychiatric:        Mood and Affect: Mood and affect normal.        Behavior: Behavior normal.     Assessment/Plan:  Principal Problem:   Metastatic squamous cell carcinoma (HCC) Active Problems:   Protein-calorie malnutrition, severe   Anemia of chronic disease   Pressure injury of skin   Physical deconditioning   Goals of care, counseling/discussion   Palliative care encounter   Mr., shows poorly differentiated squamous cell carcinoma of the neck with metastasis to the lungs and bone status post wound resection. We will continue symptom management while awaiting SNF placement.  Metastatic squamous cell carcinoma: Stable.  Plan is to see oncology as an outpatient.  Pathologic fracture at C2:MRI showed widespread bone metastasis throughout cervical spine with multiple associated pathologic fractures.Neurosurgery does not recommend surgery or c-collar at this time.  He does continue to have low back pain and bilateral lower extremity edema likely secondary to bony metastasis. 1.Schedule oxycodone IR 15 mg every 6 hours. 2. Continue bowel regimen with miralax and senokot  Urinary Retention: Likely secondary to  opioid use.Coudeplaced 09/26/18. He will need this in place for at least 1 month due to potential stretch injury 2/2acute urinary retention.He will also need to follow-up with urology as an outpatient prior to removing coud catheter. 1. Continue to monitor   Normocytic anemia:Likely due to anemia of chronic disease. Hemoglobin on 12/14 @ 7.5.  Willcontinue Vitamin B12 and Folate supplementation per below.Austin Payne does not want to seek further diagnostic testing including endoscopy/colonoscopy but does wish to continue with periodic blood transfusions. 1.Continuevitamin B12 1000 mcg once weekly for 4 weeks. 2.Continue1 mg folate once dailyindefinitely. 3.  Repeat CBC in 4-5 days.  Malnutrition: 1. Continue nutritional supplement  CODE STATUS: DNR with full scope treatment. DVT STM:[id number].Now onapixaban 2.5 mg twice daily.  Dispo: Anticipated discharge ending SNF placement.  Due to lack of insurance social work is having difficulty finding placement.  Carroll Sage, MD 10/13/2018, 11:48 AM Pager: [phone]

## 2018-10-14 DIAGNOSIS — R339 Retention of urine, unspecified: Secondary | ICD-10-CM | POA: Diagnosis not present
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
## 2018-10-14 NOTE — Progress Notes (Signed)
OT Cancellation and Sign Off Note  Patient Details Name: Austin Payne MRN: [id number] DOB: 1955/11/16   Cancelled Treatment:    Reason Eval/Treat Not Completed: Patient not medically ready;Other (comment) Per chart review, pt planning for dc to SNF and has palliative consult. Request sign off for therapies at this time. Will sign off. Please reconsult if status changes. Thank you.  [place], OTR/L Acute Rehab Pager: [phone] Office: [phone]  10/14/2018, 7:24 AM

## 2018-10-14 NOTE — Progress Notes (Signed)
   Subjective:   Mr. Egolf states that his pain is well controlled. Has been ambulating without any diffifulties. He endorses decreased appetite because of his lack of desire to eat the hospital food. He states that he wants [employer] pizza from [employer] and also indicates that "he is an expensive date." He states he has been drinking lots of fluids and BM are wnl. He denies nausea and states he has been working with PT.   Objective:  Vital signs in last 24 hours: Vitals:   10/13/18 0531 10/13/18 1424 10/13/18 2144 10/14/18 0531  BP: 113/69 108/70 101/71 104/68  Pulse: (!) 104 (!) 104 (!) 108 (!) 107  Resp: 16 18 12 16   Temp: 98.8 F (37.1 C) 97.9 F (36.6 C) 98.3 F (36.8 C) 98.1 F (36.7 C)  TempSrc: Oral Oral Oral   SpO2: 93% 95% 96% 96%  Weight: 56.1 kg   54.8 kg  Height:       Physical Exam Constitution: NAD, cachectic HENT: left neck wound with granulation tissue, non-purulent, loose dressings in place, poor dentition Cardio: tachycardic, no m/r/g Respiratory: CTA, no w/r/r Abdominal: NTTP. Soft, non-distended  Assessment/Plan:  Principal Problem:   Metastatic squamous cell carcinoma (HCC) Active Problems:   Protein-calorie malnutrition, severe   Anemia of chronic disease   Pressure injury of skin   Physical deconditioning   Goals of care, counseling/discussion   Palliative care encounter  62yo male presented with open left neck wound now s/p resection with positive margins for SCC and metastasis to lungs and cervical spine and pathologic fracture to C2 on MRI.  Metastatic SCC: Stable. Left neck wound clean with granulation tissue. Will follow with oncology outpatient. He is stable for discharge to SNF when available.   Pathologic Fracture C2: Not a surgical candidate. Pain currently controlled with oxy IR 15mg  q6h. He is having BM.   - cont. Pain medication - cont. Senokot & miralax  Urinary Retention: Coude cath placed 11/28. Retention thought to be  secondary to opioid use. Will need removal for bladder trial 12/28 and referral to urology for coude cath removal.   - monitoring   Anemia of Chronic Disease S/p 1U 12/11. Hb 12/14 7.5. Agreeable to continuing period blood transfusions but does not want any endoscopy.   - cont. b12 1000 mcg qweekly for 4 weeks - cont. Folate - CBC 12/17  Malnutrition - Cont. Supplement - encourage po intake  VTE: eliquis Diet: regular IVF: none Code: DNR with full scope treatment  Dispo: Anticipated discharge pending SNF placement.   Molli Hazard A, DO 10/14/2018, 11:49 AM Pager: [phone]

## 2018-10-14 NOTE — Progress Notes (Signed)
Spoke to patient 3 times during the mid morning to early afternoon about getting out of bed to chair. Also nurse tech tried and Delcine, Rn tried. Each time different answer. Initially agreeable to getting to chair at lunch then refused three times.

## 2018-10-15 DIAGNOSIS — D638 Anemia in other chronic diseases classified elsewhere: Secondary | ICD-10-CM

## 2018-10-15 DIAGNOSIS — D649 Anemia, unspecified: Secondary | ICD-10-CM

## 2018-10-15 DIAGNOSIS — C7800 Secondary malignant neoplasm of unspecified lung: Secondary | ICD-10-CM

## 2018-10-15 DIAGNOSIS — Z515 Encounter for palliative care: Secondary | ICD-10-CM

## 2018-10-15 DIAGNOSIS — C7951 Secondary malignant neoplasm of bone: Secondary | ICD-10-CM

## 2018-10-15 DIAGNOSIS — C799 Secondary malignant neoplasm of unspecified site: Secondary | ICD-10-CM

## 2018-10-15 LAB — BASIC METABOLIC PANEL
Anion gap: 14 (ref 5–15)
BUN: 31 mg/dL — ABNORMAL HIGH (ref 8–23)
CO2: 29 mmol/L (ref 22–32)
Calcium: 9.4 mg/dL (ref 8.9–10.3)
Chloride: 90 mmol/L — ABNORMAL LOW (ref 98–111)
Creatinine, Ser: 0.74 mg/dL (ref 0.61–1.24)
GFR calc Af Amer: >60 mL/min (ref >60)
GFR calc non Af Amer: >60 mL/min (ref >60)
Glucose, Bld: 112 mg/dL — ABNORMAL HIGH (ref 70–99)
Potassium: 3.5 mmol/L (ref 3.5–5.1)
Sodium: 133 mmol/L — ABNORMAL LOW (ref 135–145)

## 2018-10-15 LAB — CBC
HCT: 21.3 % — ABNORMAL LOW (ref 39.0–52.0)
HCT: 23.4 % — ABNORMAL LOW (ref 39.0–52.0)
Hemoglobin: 6.9 g/dL — CL (ref 13.0–17.0)
Hemoglobin: 8 g/dL — ABNORMAL LOW (ref 13.0–17.0)
MCH: 27.8 pg (ref 26.0–34.0)
MCH: 29.1 pg (ref 26.0–34.0)
MCHC: 32.4 g/dL (ref 30.0–36.0)
MCHC: 34.2 g/dL (ref 30.0–36.0)
MCV: 85.9 fL (ref 80.0–100.0)
MCV: 85.1 fL (ref 80.0–100.0)
Platelets: 112 10*3/uL — ABNORMAL LOW (ref 150–400)
Platelets: 105 10*3/uL — ABNORMAL LOW (ref 150–400)
RBC: 2.48 MIL/uL — ABNORMAL LOW (ref 4.22–5.81)
RBC: 2.75 MIL/uL — ABNORMAL LOW (ref 4.22–5.81)
RDW: 16.6 % — ABNORMAL HIGH (ref 11.5–15.5)
RDW: 16.6 % — ABNORMAL HIGH (ref 11.5–15.5)
WBC: 8.4 10*3/uL (ref 4.0–10.5)
WBC: 8 10*3/uL (ref 4.0–10.5)
nRBC: 3 % — ABNORMAL HIGH (ref 0.0–0.2)
nRBC: 2.2 % — ABNORMAL HIGH (ref 0.0–0.2)

## 2018-10-15 LAB — PREPARE RBC (CROSSMATCH)

## 2018-10-15 MED ORDER — SODIUM CHLORIDE 0.9% IV SOLUTION: Freq: Once | INTRAVENOUS | Status: DC

## 2018-10-15 MED ORDER — SODIUM CHLORIDE 0.9% IV SOLUTION
Freq: Once | INTRAVENOUS | Status: AC
  Administered 2018-10-15: 06:00:00 via INTRAVENOUS

## 2018-10-15 NOTE — Progress Notes (Signed)
   10/15/18 1200  Clinical Encounter Type  Visited With Patient  Visit Type Initial  Referral From Nurse  Consult/Referral To Chaplain  Spiritual Encounters  Spiritual Needs Emotional;Grief support  Stress Factors  Patient Stress Factors Exhausted;Health changes;Major life changes   Responded to spiritual care consult. PT was alert and open for a visit. PT was wanting to share some intimate details about his responsibilities after leaving the hospital. I offered spiritual care with words of encouragement, a listening ear, and ministry of presence. Chaplain available as needed.  Chaplain Fidel Levy [phone]
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
## 2018-10-15 NOTE — Progress Notes (Signed)
HGB reported to IM at this time.

## 2018-10-15 NOTE — Progress Notes (Signed)
Orders for blood transfusion.

## 2018-10-15 NOTE — Progress Notes (Signed)
   Subjective:  He states he is feeling well today. He denies weakness, nausea. He states he is trying to eat well although does not like hospital food. He states he is continuing to have bowel movements without issue.   Objective:  Vital signs in last 24 hours: Vitals:   10/14/18 1040 10/14/18 1453 10/14/18 2117 10/15/18 0444  BP: 102/69 106/68 100/66 96/65  Pulse: (!) 112 (!) 117 (!) 120 (!) 114  Resp:  16 18 18   Temp:  98.1 F (36.7 C) 98.1 F (36.7 C) 98.3 F (36.8 C)  TempSrc:   Oral Oral  SpO2:  96% 93% 95%  Weight:    54.3 kg  Height:       Physical Exam: Constitution: NAD, cachectic  Cardio: RRR  Abdominal: distended, LLQ mass, NTTP, soft GU: foley in place Neuro: a&o, pleasant Skin:   Assessment/Plan:  Principal Problem:   Metastatic squamous cell carcinoma (HCC) Active Problems:   Protein-calorie malnutrition, severe   Anemia of chronic disease   Pressure injury of skin   Physical deconditioning   Goals of care, counseling/discussion   Palliative care encounter   Urinary retention  62yo male presented with open left neck wound now s/p resection with positive margins for SCC and metastasis to lungs and cervical spine and pathologic fracture to C2 on MRI.  Metastatic SCC Currently stable, left neck wound clean. Previously patient stating he did not want chemotherapy but is now stating he would like further treatment. Discussed with oncology, and he would need to follow-up at the [facility] in the next week. He would likely have IV chemo treatment if he was treated. If starting chemo in the next few weeks this will need to be coordinated with a SNF that is agreeable.   - will f/u oncology recommendations   Pathologic Fracture C2 Pain controlled with oxy IR 15 mg q6h. Cont. Senokot & miralax   Anemia of Chronic Disease Hgb 6.9 today. Transfusing 1U. Last transfused 12/14. He does not want to be scoped but would like to continue transfusing for low  hemoglobin.   - cont. b12 1000 mcg for 4 weeks - cont. Folate - post-transfusion CBC   Urinary Retention  Coude cath placed 11/28. Retention thought to be secondary to opioid use. Will need removal for bladder trial 12/28 and referral to urology for coude cath removal.   - monitoring   Malnutrition Nutrition following. Continuing supplementation and encouraging po intake    VTE: eliquis IVF: none Diet: regular Code: DNR full scope of treatment otherwise  Dispo: Anticipated discharge in approximately 1-2 days.   Molli Hazard A, DO 10/15/2018, 7:19 AM Pager: [phone]

## 2018-10-15 NOTE — Clinical Social Work Note (Signed)
Clinical Social Work Assessment  Patient Details  Name: Austin Payne MRN: [id number] Date of Birth: 11/11/1955  Date of referral:  10/15/18               Reason for consult:  Facility Placement                Permission sought to share information with:  Facility Sport and exercise psychologist, Family Supports Permission granted to share information::  Yes, Verbal Permission Granted  Name::        Agency::  SNFs  Relationship::     Contact Information:     Housing/Transportation Living arrangements for the past 2 months:  Hotel/Motel Source of Information:  Patient Patient Interpreter Needed:  None Criminal Activity/Legal Involvement Pertinent to Current Situation/Hospitalization:  No - Comment as needed Significant Relationships:  None Lives with:  Self Do you feel safe going back to the place where you live?  No Need for family participation in patient care:  No (Coment)  Care giving concerns:  CSW received consult for possible SNF placement at time of discharge. CSW spoke with patient regarding PT recommendation of SNF placement at time of discharge. Patient reported that he is unable to return to the hotel that he came from and does not have any family assistance. Patient expressed understanding of PT recommendation and is agreeable to SNF placement at time of discharge. CSW to continue to follow and assist with discharge planning needs.   Social Worker assessment / plan:  CSW spoke with patient concerning possibility of rehab at [facility] before returning home.  Employment status:  Disabled (Comment on whether or not currently receiving Disability) Insurance information:  Medicaid In [place] PT Recommendations:  [place] / Referral to community resources:  [place]  Patient/Family's Response to care:  Patient recognizes need for rehab before returning home and is agreeable to a SNF. CSW explained potential barriers to placement, which patient  reported understanding.   Patient/Family's Understanding of and Emotional Response to Diagnosis, Current Treatment, and Prognosis:  Patient is realistic regarding therapy needs and expressed being hopeful for SNF placement. Patient expressed understanding of CSW role and discharge process as well as medical condition. No questions/concerns about plan or treatment.    Emotional Assessment Appearance:  Appears older than stated age Attitude/Demeanor/Rapport:  Guarded, Hostile Affect (typically observed):  Defensive Orientation:  Oriented to Self, Oriented to Place, Oriented to  Time, Oriented to Situation Alcohol / Substance use:  Not Applicable Psych involvement (Current and /or in the community):  No (Comment)  Discharge Needs  Concerns to be addressed:  Care Coordination Readmission within the last 30 days:  No Current discharge risk:  Chronically ill, Dependent with Mobility, Inadequate Financial Supports, Homeless Barriers to Discharge:  No SNF bed   Benard Halsted, LCSW 10/15/2018, 11:50 AM

## 2018-10-16 DIAGNOSIS — D509 Iron deficiency anemia, unspecified: Secondary | ICD-10-CM

## 2018-10-16 LAB — BPAM RBC
Blood Product Expiration Date: 201912202359
ISSUE DATE / TIME: 201912171335
Unit Type and Rh: 1700

## 2018-10-16 LAB — TYPE AND SCREEN
ABO/RH(D): B NEG
Antibody Screen: NEGATIVE
Unit division: 0

## 2018-10-16 MED ORDER — SODIUM CHLORIDE 0.9 % IV BOLUS
1000.0000 mL | Freq: Once | INTRAVENOUS | Status: AC
  Administered 2018-10-16: 1000 mL via INTRAVENOUS

## 2018-10-16 NOTE — Progress Notes (Signed)
   Subjective:  [name] feeling slightly aggravated on not getting Payne lot of sleep overnight due to interruptions and receiving blood and fluids. He met with oncology yesterday and does not want to pursue specific treatment for his chemo but states he would like to continue living his life to the fullest and has aspirations to become independent again.   Objective:  Vital signs in last 24 hours: Vitals:   10/15/18 1543 10/15/18 1645 10/15/18 2128 10/16/18 0610  BP: 109/67 103/66 109/63 (!) 96/59  Pulse: (!) 101 (!) 101 97 98  Resp: _0 Temp: 98.2 F (36.8 C) 98 F (36.7 C) 98 F (36.7 C) 98.1 F (36.7 C)  TempSrc: Oral Oral Oral Oral  SpO2: 96% 96% 93% 94%  Weight:      Height:       Physical Exam: Constitution: NAD, cachectic Cardio: tachycardic, regular rhythm, no m/r/g Respiratory: CTAB, no w/r/r  Neuro: Payne&o, grumpy  Skin: c/d/i    Assessment/Plan:  Principal Problem:   Metastatic squamous cell carcinoma (HCC) Active Problems:   Protein-calorie malnutrition, severe   Anemia of chronic disease   Pressure injury of skin   Physical deconditioning   Goals of care, counseling/discussion   Palliative care encounter   Urinary retention  62yo male presented with open left neck wound now s/p resection with positive margins for SCC and metastasis to lungs and cervical spine and pathologic fracture to C2 on MRI.  Metastatic SCC Seen by oncology yesterday, and it appears he would not really benefit from chemo or other interventions considering his current nutritional status and condition. Oncology discussed this with him, and he agrees chemo would not be the best course. He would however like to pursue his life to the fullest and has aspirations to become independent and obtain Payne job after SNF. I am unsure how realistic this may be for him but do not think it unreasonable for him to have these goals. He will be stable for discharge to SNF in the morning.   Pathologic  Fracture C2 Pain controlled   Anemia of Chronic Disease Iron Deficiency Anemia Hgb 8 after transfusion yesterday. Ferritin decreased.   - cont. b12 1000 mcg 4 weeks - cont. Folate  - iron infusion   Urinary Retention Coude cath placed 11/28. Retention thought to be secondary to opioid use. Will need removal for bladder trial 12/28 and referral to urology for coude cath removal.   - monitoring   Malnutrition Nutrition following. Continuing supplementation and encouraging po intake   VTE: eliquis IVF: 1L NS bolus Diet: regular Code: DNR with full scope of care  Dispo: Anticipated discharge tomorrow.   Austin Hazard A, DO 10/16/2018, 7:21 AM Pager: [phone]

## 2018-10-16 NOTE — Progress Notes (Signed)
[facility] reviewing to see if they can accept patient today. They are able to provide Palliative/Hospice services to patient.    Percell Locus Glendoris Nodarse LCSW [phone]

## 2018-10-17 MED ORDER — NICOTINE 21 MG/24HR TD PT24: 21.0000 mg | MEDICATED_PATCH | Freq: Every day | TRANSDERMAL | 0 refills | Status: AC

## 2018-10-17 MED ORDER — POLYETHYLENE GLYCOL 3350 17 G PO PACK: 17.0000 g | PACK | Freq: Every day | ORAL | 0 refills | Status: AC

## 2018-10-17 MED ORDER — OXYCODONE HCL 15 MG PO TABS: 15.0000 mg | ORAL_TABLET | Freq: Four times a day (QID) | ORAL | 0 refills | Status: AC

## 2018-10-17 MED ORDER — ENSURE ENLIVE PO LIQD: 237.0000 mL | Freq: Three times a day (TID) | ORAL | 12 refills | Status: AC

## 2018-10-17 MED ORDER — FOLIC ACID 1 MG PO TABS: 1.0000 mg | ORAL_TABLET | Freq: Every day | ORAL | Status: AC

## 2018-10-17 MED ORDER — TAMSULOSIN HCL 0.4 MG PO CAPS: 0.4000 mg | ORAL_CAPSULE | Freq: Every day | ORAL | Status: AC

## 2018-10-17 MED ORDER — ADULT MULTIVITAMIN W/MINERALS CH: 1.0000 | ORAL_TABLET | Freq: Every day | ORAL | Status: AC

## 2018-10-17 MED ORDER — SENNA 8.6 MG PO TABS: 2.0000 | ORAL_TABLET | Freq: Every day | ORAL | 0 refills | Status: AC

## 2018-10-17 MED ORDER — CYANOCOBALAMIN 1000 MCG/ML IJ SOLN: INTRAMUSCULAR | 2 refills | Status: AC

## 2018-10-17 MED ORDER — METOPROLOL TARTRATE 25 MG PO TABS: 12.5000 mg | ORAL_TABLET | Freq: Two times a day (BID) | ORAL | Status: AC

## 2018-10-17 NOTE — Progress Notes (Signed)
Austin Payne to be D/C'd to SNF per MD order.  Discussed with the patient and all questions fully answered.  IV catheter discontinued intact. Site without signs and symptoms of complications. Dressing and pressure applied.  An After Visit Summary was printed and given to PTAR. Prescriptions sent to pharmacy.  Report called to receiving facility, [facility].  D/c education completed with patient/family including follow up instructions, medication list, d/c activities limitations if indicated, with other d/c instructions as indicated by MD - patient able to verbalize understanding, all questions fully answered.   Patient instructed to return to ED, call 911, or call MD for any changes in condition.   Patient escorted via strecther, and D/C to SNF via PTAR.  [name] 10/17/2018 10:15 AM

## 2018-10-17 NOTE — Clinical Social Work Placement (Signed)
   CLINICAL SOCIAL WORK PLACEMENT  NOTE  Date:  10/17/2018  Patient Details  Name: Austin Payne MRN: [id number] Date of Birth: 1956/06/15  Clinical Social Work is seeking post-discharge placement for this patient at the [place] level of care (*CSW will initial, date and re-position this form in  chart as items are completed):  Yes   Patient/family provided with [place] Work Department's list of facilities offering this level of care within the geographic area requested by the patient (or if unable, by the patient's family).  Yes   Patient/family informed of their freedom to choose among providers that offer the needed level of care, that participate in Medicare, Medicaid or managed care program needed by the patient, have an available bed and are willing to accept the patient.  Yes   Patient/family informed of [facility]'s ownership interest in [facility] and [facility], as well as of the fact that they are under no obligation to receive care at these facilities.  PASRR submitted to EDS on 09/16/18     PASRR number received on 09/16/18     Existing PASRR number confirmed on       FL2 transmitted to all facilities in geographic area requested by pt/family on 09/16/18     FL2 transmitted to all facilities within larger geographic area on       Patient informed that his/her managed care company has contracts with or will negotiate with certain facilities, including the following:        Yes   Patient/family informed of bed offers received.  Patient chooses bed at [facility]     Physician recommends and patient chooses bed at      Patient to be transferred to [facility] on 10/17/18.  Patient to be transferred to facility by PTAR     Patient family notified on 10/17/18 of transfer.  Name of family member notified:  Patient has no family      PHYSICIAN       Additional Comment:     _______________________________________________ Benard Halsted, LCSW 10/17/2018, 9:28 AM

## 2018-10-17 NOTE — Progress Notes (Signed)
Patient will DC to: [facility] Anticipated DC date: 10/17/18 Family notified: None available, patient oriented Transport by: Corey Harold   Per MD patient ready for DC to [place]. RN, patient, patient's family, and facility notified of DC. Discharge Summary and FL2 sent to facility. RN to call report prior to discharge [phone] Room 117A). DC packet on chart. Ambulance transport requested for patient.   CSW will sign off for now as social work intervention is no longer needed. Please consult [place] again if new needs arise.  Cedric Fishman, LCSW Clinical Social Worker [phone]

## 2018-11-30 DEATH — deceased

## 2019-05-07 ENCOUNTER — Other Ambulatory Visit: Payer: Self-pay | Admitting: Oncology

## 2020-03-19 IMAGING — MR MR CERVICAL SPINE WO/W CM
6 of 8 series · 35 of 48 positions shown · IV contrast (gadavist)
Comparison: Prior CTs of the neck and chest from 09/07/2018 and
09/08/2018.

CLINICAL DATA: Initial evaluation for osseous metastatic disease
with pathologic fracture.

EXAM:
MRI CERVICAL SPINE WITHOUT AND WITH CONTRAST
TECHNIQUE: Multiplanar and multiecho pulse sequences of the cervical spine, to
include the craniocervical junction and cervicothoracic junction,
were obtained without and with intravenous contrast.
CONTRAST:  5 cc of Gadavist.

[Series 2: T1 · sagittal · 3.0mm · 0.43mm/px · 4 of 15 slices shown (1 of 2)]
[im 1/15]
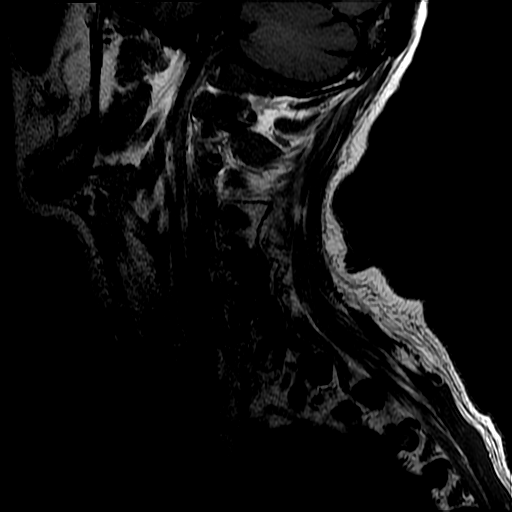
[im 5/15]
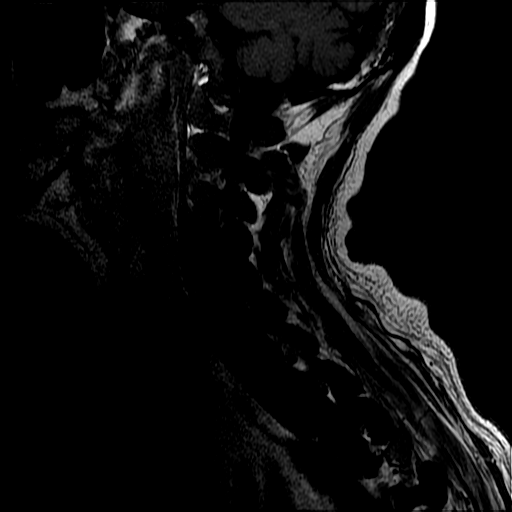
[im 10/15]
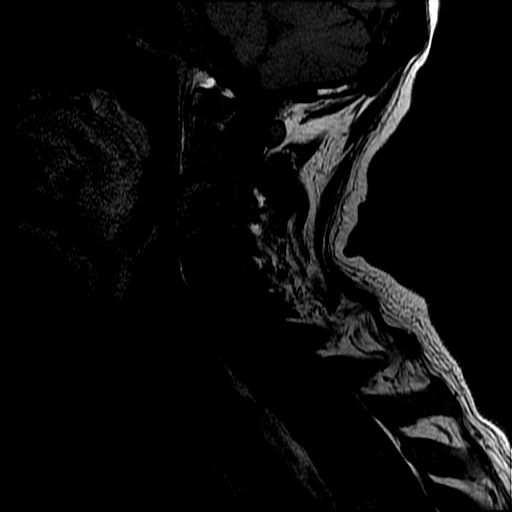
[im 15/15]
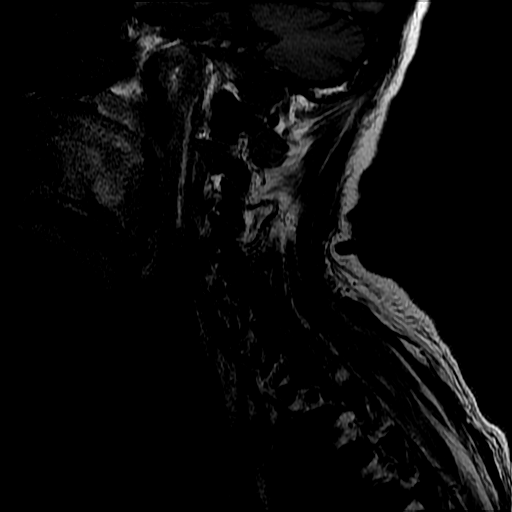

[Series 3: T2 · sagittal · 3.0mm · 0.43mm/px · 3 of 15 slices shown (1 of 2)]
[im 1/15]
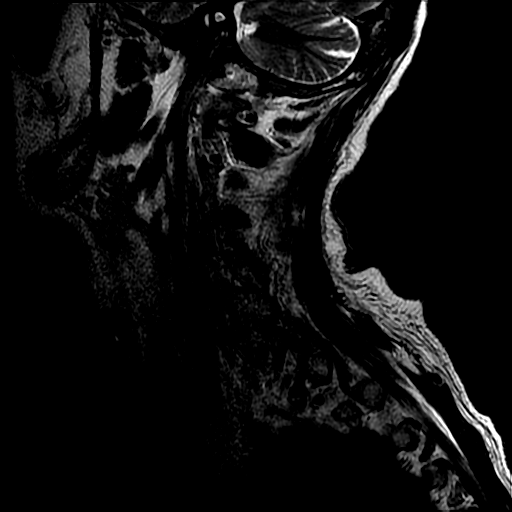
[im 8/15]
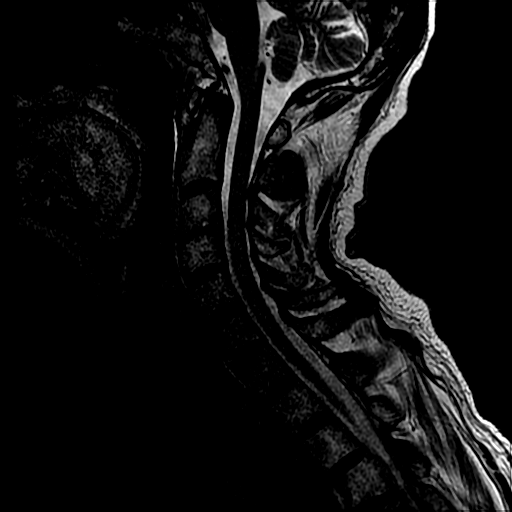
[im 15/15]
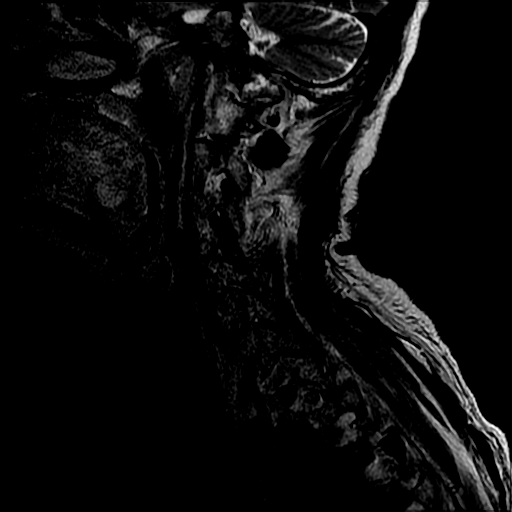

[Series 6: T2 · axial · 3.0mm · 0.78mm/px · z∈[-107,+14]mm · 9 of 37 slices shown (2 of 2)]
[im 1/37]
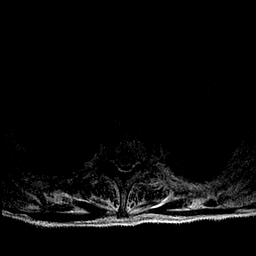
[im 5/37]
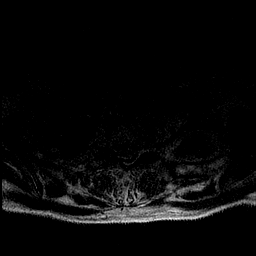
[im 10/37]
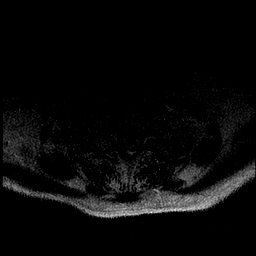
[im 14/37]
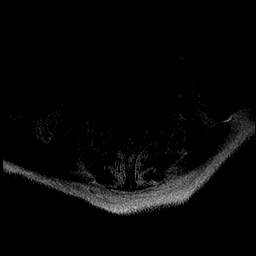
[im 19/37]
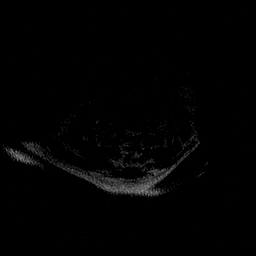
[im 23/37]
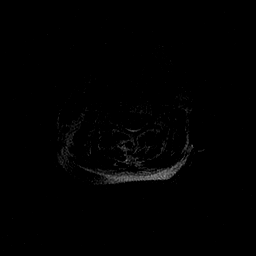
[im 28/37]
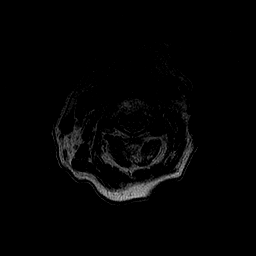
[im 32/37]
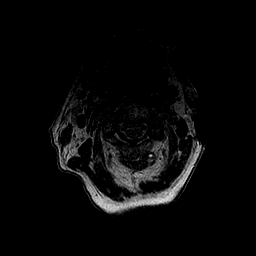
[im 37/37]
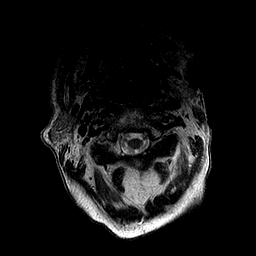

[Series 7: T1 · axial · non-contrast · 3.0mm · 0.78mm/px · z∈[-107,+14]mm · 9 of 37 slices shown (2 of 2)]
[im 1/37]
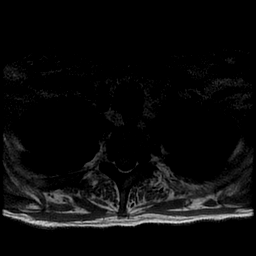
[im 5/37]
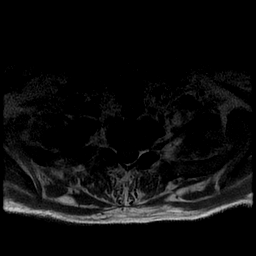
[im 10/37]
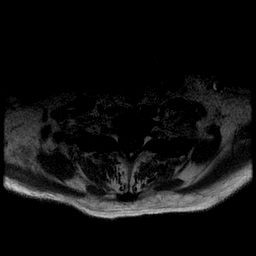
[im 14/37]
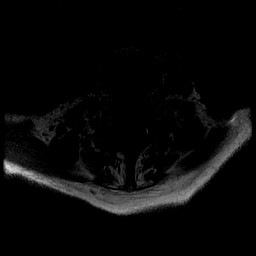
[im 19/37]
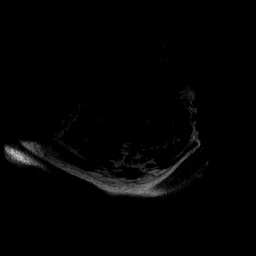
[im 23/37]
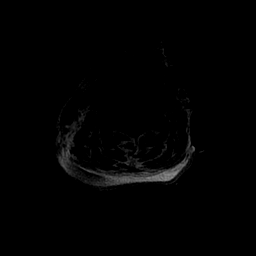
[im 28/37]
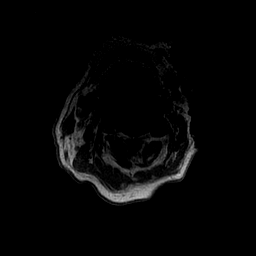
[im 32/37]
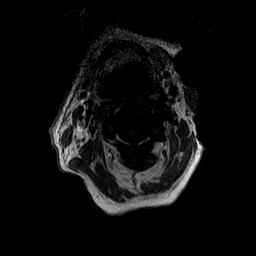
[im 37/37]
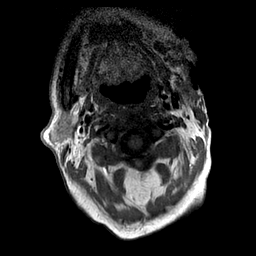

[Series 8: T1 fat-sat post-contrast · sagittal · 3.0mm · 0.43mm/px · 3 of 15 slices shown]
[im 1/15]
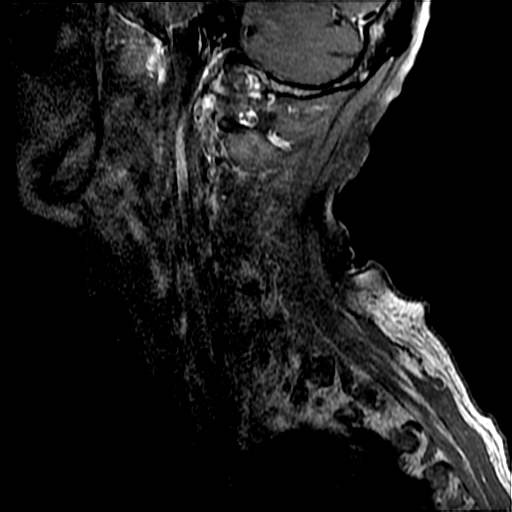
[im 8/15]
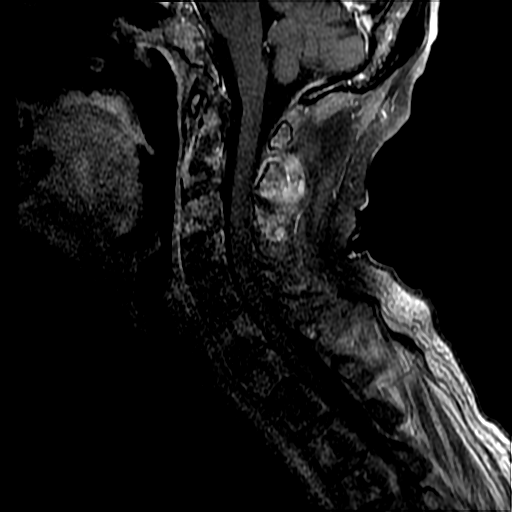
[im 15/15]
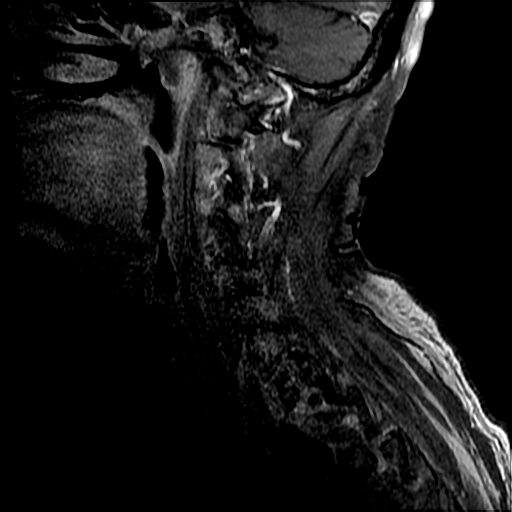

[Series 9: T1 post-contrast · axial · 3.0mm · 0.78mm/px · z∈[-107,-3]mm · 7 of 36 slices shown]
[im 1/36]
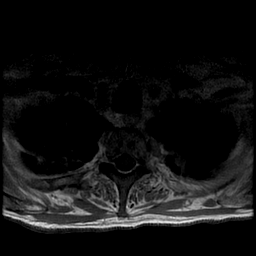
[im 6/36]
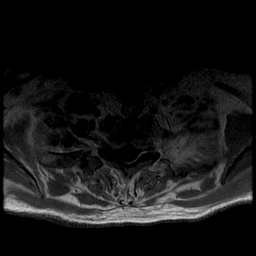
[im 11/36]
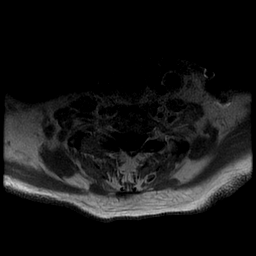
[im 16/36]
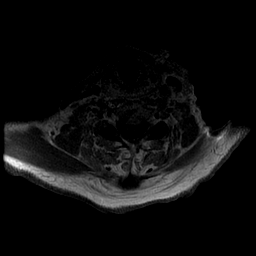
[im 21/36]
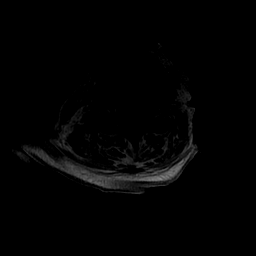
[im 26/36]
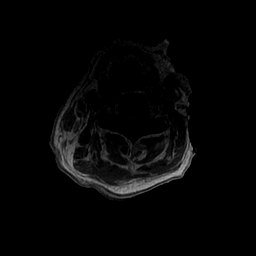
[im 31/36]
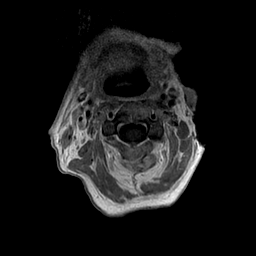

[35 of 48 positions shown; findings below may reference images not displayed]

FINDINGS: Alignment: Vertebral bodies normally aligned with preservation of
the normal cervical lordosis. No listhesis.

Vertebrae: Widespread osseous metastases seen throughout the
visualized osseous structures, corresponding with abnormality seen
on prior CTs. There is involvement of essentially all levels of the
visualized cervicothoracic spine. Heaviest involvement present at
the C2 and C3 levels with prominent metastases involving the spinous
processes of C2 and C3 (series 8, image 7). Associated pathologic
fracture at C2 better assessed on prior CT. Additional mild height
loss at the superior endplate of C7 could be pathologic as well,
although is chronic in appearance. Vertebral body heights otherwise
pathologic fracture identified. No significant extra osseous
extension of tumor. Underlying bone marrow signal intensity
demonstrates diffusely abnormal hypointense T1 signal intensity,
which can be seen in the setting of marrow infiltration.

Cord: Signal intensity within the cervical spinal cord is normal. No
abnormal cord edema or enhancement. No new epidural tumor
identified.

Posterior Fossa, vertebral arteries, paraspinal tissues: Visualized
brain and posterior fossa within normal limits. Craniocervical
junction normal. Mild soft tissue edema and enhancement adjacent to
the C2 and C3 spinous process metastases. Paraspinous soft tissues
demonstrate no other acute abnormality. Focal skin thickening and
ulceration at the left face/neck partially visualized, better
evaluated on prior CT. Normal intravascular flow voids seen within
the vertebral arteries bilaterally.

Disc levels:

C2-C3: Unremarkable.

C3-C4: Chronic intervertebral disc space narrowing with mild diffuse
disc bulge and uncovertebral hypertrophy. No spinal stenosis.
Moderate left with mild right C4 foraminal stenosis.

C4-C5: Mild diffuse disc bulge. No significant spinal stenosis. Mild
left C5 foraminal narrowing.

C5-C6: Mild disc bulge with left-sided uncovertebral hypertrophy.
Moderate left C6 foraminal narrowing. Central canal widely patent.

C6-C7:  Unremarkable.

C7-T1:  Unremarkable.

Visualized upper thoracic spine demonstrates no acute finding.
IMPRESSION: 1. Widespread osseous metastases throughout the cervical spine.
Associated pathologic fracture of the posterior elements of C2 as
seen on prior CT. Mild height loss at the superior endplate of C[DATE] be pathologic as well, although is chronic in appearance.
2. No significant extra osseous or epidural tumor identified.
3. Focal skin thickening with ulceration involving the left
face/neck, better evaluated on prior neck CT.
4. Mild for age multilevel degenerative spondylolysis as above
without spinal stenosis. Moderate left with mild right C4 foraminal
narrowing, mild left C5 foraminal stenosis, with moderate left C6
foraminal narrowing.
